# Patient Record
Sex: Male | Born: 1940 | Race: White | Hispanic: No | Marital: Married | Smoking: Former smoker
Health system: Southern US, Community
[De-identification: ages and names within clinical notes are randomized; demographics above are authoritative.]

## PROBLEM LIST (undated history)

## (undated) DIAGNOSIS — C61 Malignant neoplasm of prostate: Secondary | ICD-10-CM

## (undated) DIAGNOSIS — H539 Unspecified visual disturbance: Secondary | ICD-10-CM

## (undated) DIAGNOSIS — E119 Type 2 diabetes mellitus without complications: Secondary | ICD-10-CM

## (undated) DIAGNOSIS — C801 Malignant (primary) neoplasm, unspecified: Secondary | ICD-10-CM

## (undated) DIAGNOSIS — I639 Cerebral infarction, unspecified: Secondary | ICD-10-CM

## (undated) DIAGNOSIS — M1990 Unspecified osteoarthritis, unspecified site: Secondary | ICD-10-CM

## (undated) DIAGNOSIS — I10 Essential (primary) hypertension: Secondary | ICD-10-CM

## (undated) HISTORY — PX: APPENDECTOMY: SHX54

## (undated) HISTORY — PX: BACK SURGERY: SHX140

## (undated) HISTORY — DX: Malignant neoplasm of prostate: C61

## (undated) HISTORY — DX: Cerebral infarction, unspecified: I63.9

## (undated) HISTORY — PX: TONSILLECTOMY: SUR1361

## (undated) HISTORY — PX: EYE SURGERY: SHX253

## (undated) HISTORY — DX: Unspecified visual disturbance: H53.9

## (undated) HISTORY — DX: Type 2 diabetes mellitus without complications: E11.9

---

## 2008-06-06 HISTORY — PX: NECK SURGERY: SHX720

## 2010-07-08 ENCOUNTER — Emergency Department (INDEPENDENT_AMBULATORY_CARE_PROVIDER_SITE_OTHER): Payer: Medicare Other

## 2010-07-08 ENCOUNTER — Emergency Department (HOSPITAL_BASED_OUTPATIENT_CLINIC_OR_DEPARTMENT_OTHER)
Admission: EM | Admit: 2010-07-08 | Discharge: 2010-07-09 | Disposition: A | Discharge status: Home / Self Care | Payer: Medicare Other | Attending: Emergency Medicine | Admitting: Emergency Medicine

## 2010-07-08 DIAGNOSIS — R209 Unspecified disturbances of skin sensation: Secondary | ICD-10-CM

## 2010-07-08 DIAGNOSIS — M5020 Other cervical disc displacement, unspecified cervical region: Secondary | ICD-10-CM

## 2010-07-08 DIAGNOSIS — M47812 Spondylosis without myelopathy or radiculopathy, cervical region: Secondary | ICD-10-CM | POA: Insufficient documentation

## 2010-07-08 DIAGNOSIS — M542 Cervicalgia: Secondary | ICD-10-CM

## 2010-07-08 DIAGNOSIS — M62838 Other muscle spasm: Secondary | ICD-10-CM | POA: Insufficient documentation

## 2010-07-08 DIAGNOSIS — M5412 Radiculopathy, cervical region: Secondary | ICD-10-CM | POA: Insufficient documentation

## 2010-07-08 DIAGNOSIS — E7800 Pure hypercholesterolemia, unspecified: Secondary | ICD-10-CM | POA: Insufficient documentation

## 2010-07-08 DIAGNOSIS — M79609 Pain in unspecified limb: Secondary | ICD-10-CM | POA: Insufficient documentation

## 2010-07-09 ENCOUNTER — Other Ambulatory Visit (HOSPITAL_BASED_OUTPATIENT_CLINIC_OR_DEPARTMENT_OTHER): Payer: Self-pay | Admitting: Emergency Medicine

## 2010-07-09 DIAGNOSIS — R52 Pain, unspecified: Secondary | ICD-10-CM

## 2010-07-09 DIAGNOSIS — M479 Spondylosis, unspecified: Secondary | ICD-10-CM

## 2010-07-10 ENCOUNTER — Emergency Department (HOSPITAL_BASED_OUTPATIENT_CLINIC_OR_DEPARTMENT_OTHER)
Admission: RE | Admit: 2010-07-10 | Discharge: 2010-07-10 | Disposition: A | Discharge status: Home / Self Care | Payer: Medicare Other | Source: Ambulatory Visit | Attending: Emergency Medicine | Admitting: Emergency Medicine

## 2010-07-10 DIAGNOSIS — M47812 Spondylosis without myelopathy or radiculopathy, cervical region: Secondary | ICD-10-CM | POA: Insufficient documentation

## 2010-07-10 DIAGNOSIS — M549 Dorsalgia, unspecified: Secondary | ICD-10-CM | POA: Insufficient documentation

## 2010-07-10 DIAGNOSIS — M5412 Radiculopathy, cervical region: Secondary | ICD-10-CM | POA: Insufficient documentation

## 2010-07-10 DIAGNOSIS — M479 Spondylosis, unspecified: Secondary | ICD-10-CM

## 2012-06-06 HISTORY — PX: CATARACT EXTRACTION: SUR2

## 2015-08-07 ENCOUNTER — Emergency Department (HOSPITAL_BASED_OUTPATIENT_CLINIC_OR_DEPARTMENT_OTHER): Payer: Medicare Other

## 2015-08-07 ENCOUNTER — Emergency Department (HOSPITAL_BASED_OUTPATIENT_CLINIC_OR_DEPARTMENT_OTHER)
Admission: EM | Admit: 2015-08-07 | Discharge: 2015-08-07 | Disposition: A | Discharge status: Home / Self Care | Payer: Medicare Other | Attending: Physician Assistant | Admitting: Physician Assistant

## 2015-08-07 ENCOUNTER — Encounter (HOSPITAL_BASED_OUTPATIENT_CLINIC_OR_DEPARTMENT_OTHER): Payer: Self-pay | Admitting: Emergency Medicine

## 2015-08-07 DIAGNOSIS — Y998 Other external cause status: Secondary | ICD-10-CM | POA: Insufficient documentation

## 2015-08-07 DIAGNOSIS — W1839XA Other fall on same level, initial encounter: Secondary | ICD-10-CM | POA: Diagnosis not present

## 2015-08-07 DIAGNOSIS — I10 Essential (primary) hypertension: Secondary | ICD-10-CM | POA: Diagnosis not present

## 2015-08-07 DIAGNOSIS — Y9289 Other specified places as the place of occurrence of the external cause: Secondary | ICD-10-CM | POA: Insufficient documentation

## 2015-08-07 DIAGNOSIS — Z8739 Personal history of other diseases of the musculoskeletal system and connective tissue: Secondary | ICD-10-CM | POA: Insufficient documentation

## 2015-08-07 DIAGNOSIS — Z859 Personal history of malignant neoplasm, unspecified: Secondary | ICD-10-CM | POA: Diagnosis not present

## 2015-08-07 DIAGNOSIS — Z87891 Personal history of nicotine dependence: Secondary | ICD-10-CM | POA: Insufficient documentation

## 2015-08-07 DIAGNOSIS — S79911A Unspecified injury of right hip, initial encounter: Secondary | ICD-10-CM | POA: Insufficient documentation

## 2015-08-07 DIAGNOSIS — Y9389 Activity, other specified: Secondary | ICD-10-CM | POA: Insufficient documentation

## 2015-08-07 DIAGNOSIS — Z79899 Other long term (current) drug therapy: Secondary | ICD-10-CM | POA: Insufficient documentation

## 2015-08-07 DIAGNOSIS — Z880 Allergy status to penicillin: Secondary | ICD-10-CM | POA: Diagnosis not present

## 2015-08-07 DIAGNOSIS — M25551 Pain in right hip: Secondary | ICD-10-CM

## 2015-08-07 HISTORY — DX: Malignant (primary) neoplasm, unspecified: C80.1

## 2015-08-07 HISTORY — DX: Essential (primary) hypertension: I10

## 2015-08-07 HISTORY — DX: Unspecified osteoarthritis, unspecified site: M19.90

## 2015-08-07 NOTE — ED Notes (Signed)
Right hip/leg pain after a fall a couple of weeks ago. Quick and steady gait. Pain 8/10 with ambulation.

## 2015-08-07 NOTE — ED Notes (Signed)
MD at bedside. 

## 2015-08-07 NOTE — ED Provider Notes (Signed)
CSN: [id number]     Arrival date & time 08/07/15  [id number] History   First MD Initiated Contact with Patient 08/07/15 1003     Chief Complaint  Patient presents with  . Hip Pain     (Consider location/radiation/quality/duration/timing/severity/associated sxs/prior Treatment) HPI   Patient is a 75 year old male with history of hypertension and arthritis. He is presenting today with right hip pain. Patient reports that he fell on it several weeks ago. He reports at first he thought that he was just bruised. He came here for an x-ray today because he still has mild pain. Patient ambulatory without issue. Patient has full range of motion and hip knee and ankle. There is no external signs of trauma.  Past Medical History  Diagnosis Date  . Hypertension   . Cancer ([place])   . Arthritis    Past Surgical History  Procedure Laterality Date  . Back surgery    . Neck surgery  2010  . Appendectomy    . Tonsillectomy    . Eye surgery     No family history on file. Social History  Substance Use Topics  . Smoking status: Former Smoker    Types: Cigarettes  . Smokeless tobacco: None  . Alcohol Use: No    Review of Systems  Constitutional: Negative for activity change.  Respiratory: Negative for shortness of breath.   Cardiovascular: Negative for chest pain.  Gastrointestinal: Negative for abdominal pain.  Musculoskeletal: Positive for arthralgias.      Allergies  Penicillins and Statins  Home Medications   Prior to Admission medications   Medication Sig Start Date End Date Taking? Authorizing Provider  Multiple Vitamin (MULTIVITAMIN) capsule Take 1 capsule by mouth daily.   Yes Historical Provider, MD   BP 136/59 mmHg  Pulse 76  Temp(Src) 98 F (36.7 C) (Oral)  Resp 16  Ht 5\' 9"  (1.753 m)  Wt 225 lb (102.059 kg)  BMI 33.21 kg/m2  SpO2 96% Physical Exam  Constitutional: He is oriented to person, place, and time. He appears well-nourished.  HENT:  Head: Normocephalic.   Eyes: Conjunctivae are normal.  Cardiovascular: Normal rate.   Pulmonary/Chest: Effort normal.  Musculoskeletal:  Full range of hip, knee and ankle. Pt is ambulatory without issue. No overlying erythema.  Neurological: He is oriented to person, place, and time.  Skin: Skin is warm and dry. He is not diaphoretic.  Psychiatric: He has a normal mood and affect. His behavior is normal.    ED Course  Procedures (including critical care time) Labs Review Labs Reviewed - No data to display  Imaging Review Dg Hip Unilat  With Pelvis 2-3 Views Right  08/07/2015  CLINICAL DATA:  [place] 2 weeks ago on lateral right hip and complains of pain. EXAM: DG HIP (WITH OR WITHOUT PELVIS) 2-3V RIGHT COMPARISON:  CT examination 11/18/2013 FINDINGS: Again noted are prostatic implant seeds. Pelvic bony ring is intact. Chronic ossifications or calcifications along the right pubic bone. Right hip is located. Negative for fracture. Mild sclerosis and degenerative changes along the inferior right hip joint. IMPRESSION: No acute bone abnormality to the pelvis or right hip. Electronically Signed   By: Markus Daft M.D.   On: 08/07/2015 10:38   I have personally reviewed and evaluated these images and lab results as part of my medical decision-making.   EKG Interpretation None      MDM   Final diagnoses:  Hip pain, right   patient is a pleasant 75 year old male presenting with remote  trauma to the right hip. This happened several weeks ago. Patient has full range of motion is able to ambulate without issue on the right hip. We'll get x-ray as patient requested. Anticipate negative x-rays and follow-up with PCP.  11:16 AM Xray is negative. Would not do any advanced imaging at this point since he has normal ROM, no pain with movement, and no deficits, steady ambulation.   Courteney Julio Alm, MD 08/07/15 1116

## 2015-08-07 NOTE — Discharge Instructions (Signed)
The xray of your hips was normal today.  YOu have no trouble walking and no pain when the hip is moved. We think this may be just a healing injury. Pleasefollow up with your regular PCP, they may want advanced imaging if there continues to be discomfort.   Heat Therapy Heat therapy can help ease sore, stiff, injured, and tight muscles and joints. Heat relaxes your muscles, which may help ease your pain. Heat therapy should only be used on old, pre-existing, or long-lasting (chronic) injuries. Do not use heat therapy unless told by your doctor. HOW TO USE HEAT THERAPY There are several different kinds of heat therapy, including:  Moist heat pack.  Warm water bath.  Hot water bottle.  Electric heating pad.  Heated gel pack.  Heated wrap.  Electric heating pad. GENERAL HEAT THERAPY RECOMMENDATIONS   Do not sleep while using heat therapy. Only use heat therapy while you are awake.  Your skin may turn pink while using heat therapy. Do not use heat therapy if your skin turns red.  Do not use heat therapy if you have new pain.  High heat or long exposure to heat can cause burns. Be careful when using heat therapy to avoid burning your skin.  Do not use heat therapy on areas of your skin that are already irritated, such as with a rash or sunburn. GET HELP IF:   You have blisters, redness, swelling (puffiness), or numbness.  You have new pain.  Your pain is worse. MAKE SURE YOU:  Understand these instructions.  Will watch your condition.  Will get help right away if you are not doing well or get worse.   This information is not intended to replace advice given to you by your health care provider. Make sure you discuss any questions you have with your health care provider.   Document Released: 08/15/2011 Document Revised: 06/13/2014 Document Reviewed: 07/16/2013 Elsevier Interactive Patient Education [employer].

## 2016-02-18 ENCOUNTER — Ambulatory Visit: Payer: Medicare Other | Admitting: Neurology

## 2016-02-19 ENCOUNTER — Ambulatory Visit: Payer: No Typology Code available for payment source | Admitting: Neurology

## 2016-02-22 ENCOUNTER — Encounter: Payer: Self-pay | Admitting: Neurology

## 2016-02-22 ENCOUNTER — Ambulatory Visit (INDEPENDENT_AMBULATORY_CARE_PROVIDER_SITE_OTHER): Payer: Medicare Other | Admitting: Neurology

## 2016-02-22 DIAGNOSIS — R3915 Urgency of urination: Secondary | ICD-10-CM

## 2016-02-22 DIAGNOSIS — R269 Unspecified abnormalities of gait and mobility: Secondary | ICD-10-CM

## 2016-02-22 DIAGNOSIS — M5000 Cervical disc disorder with myelopathy, unspecified cervical region: Secondary | ICD-10-CM | POA: Diagnosis not present

## 2016-02-22 DIAGNOSIS — G4489 Other headache syndrome: Secondary | ICD-10-CM | POA: Diagnosis not present

## 2016-02-22 DIAGNOSIS — H34239 Retinal artery branch occlusion, unspecified eye: Secondary | ICD-10-CM | POA: Insufficient documentation

## 2016-02-22 DIAGNOSIS — R208 Other disturbances of skin sensation: Secondary | ICD-10-CM | POA: Diagnosis not present

## 2016-02-22 DIAGNOSIS — H34232 Retinal artery branch occlusion, left eye: Secondary | ICD-10-CM

## 2016-02-22 MED ORDER — OXYBUTYNIN CHLORIDE 5 MG PO TABS: 5.0000 mg | ORAL_TABLET | Freq: Three times a day (TID) | ORAL | 5 refills | Status: DC

## 2016-02-22 MED ORDER — GABAPENTIN 300 MG PO CAPS: 300.0000 mg | ORAL_CAPSULE | Freq: Four times a day (QID) | ORAL | 3 refills | Status: DC

## 2016-02-22 NOTE — Progress Notes (Signed)
[facility]  PATIENT: Juan Raymond DOB: [birth year]  REFERRING DOCTOR OR PCP:  Alphonzo Severance PA SOURCE: patient, notes in EMR,   _________________________________   HISTORICAL  CHIEF COMPLAINT:  Chief Complaint  Patient presents with  . Peripheral Neuropathy    Juan Raymond is here with his wife Juan Raymond for eval of numbness in bilat legs from the knees down, including feet, and in bilat hands.  Worse in right leg.  Hx. of lumbar surgery and also just dx.with Diabetes.  He also c/o occasional sharp shooting pains in random areas of head.  Sts.  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  Sts. he had mult. tests to determine the cause of the stroke, was told it was probably from the plaque in the carotid artery.  He had an MRI brain several mos. ago at [facility] and   . Headache    would like RAS to look at it and give an opinion/fim    HISTORY OF PRESENT ILLNESS:  I had the pleasure of seeing your patient, Juan Raymond, at [facility] for a neurologic consultation regarding his peripheral neuropathy, headaches and history of retinal stroke.   He is a 75 year old man who has had numbness for many years  from the knees on down to his feet. Symptoms are numbness > pain.   When present, pain is mostly in the feet, especially the soles. Touch will sometimes feel like burning. He feels the pain improved some after he had cervical spine surgery in 2012.  He still gets cramps.    He also notes some numbness in his fingers. 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 He was referred to Dr. Lang Snow in [facility] who did an ACDF at C3-C4 and C4-C5.   He was having neck pain prior to the surgery and that improved. However, the numbness did not improve.  Due to the continual numbness, at some point he was referred to Dr. Sabra Heck at  regional neurology. He had a NCV/EMG and was told that it just showed minimal nerve changes not enough to explain his symptoms.    He notes some weakness in his legs (esp climbing stairs) and a reduced grip.   Gabapentin helps the painful dysesthesias about 25 percent.Marland Kitchen He is currently on low dose, 300 mg twice a day. He tolerates well and denies any sleepiness or difficulty with balance.  He has urinary frequency and urgency.   He has rare incontinence if he can't get to a bathroom in time.    He has a h/o prostate cancer and had radioactive seeds.   PSA has recently increased and sees Dr. Felipa Eth.   Last year, he also had lumbar surgery (at [facility]).  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   He notes a poor grip  He gets pain pain in the left > right temple intermittently.  Sometimes pain is shooting.    He gets tired chewing but no pain.    ESR was normal 09/2015.        February 2017 he had sudden vision loss on the left and was told he had a retinal stroke.  History and reports are consistent with a branch retinal artery  occlusion. He had a carotid doppler and echocardiogram that were unremarkable.   He takes aspirin.    ESR was normal.    Stroke risk:   He has recently NIDDM and HTN.   He used to be a smoker (quit 3-4 years ago).   He has mild hypercholesterolemia.    I personally reviewed the MRI of the cervical spine dated 07/10/2010 an MRI of the brain dated 09/17/2015. 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 Additionally, there is moderate spinal stenosis at C4-C5 without any associated signal changes within the spinal cord. There are milder degenerative changes at the other cervical levels but do not lead to any spinal cord impingement. The MRI of the brain 09/17/2015 is normal for age with mild cortical  atrophy.   REVIEW OF SYSTEMS: Constitutional: No fevers, chills, sweats, or change in appetite Eyes: Reduced visual acuity OS.  No eye pain.  Ear, nose and throat: No hearing loss, ear pain, nasal congestion, sore throat Cardiovascular: No chest pain, palpitations Respiratory: No shortness of breath at rest or with exertion.   No wheezes GastrointestinaI: No nausea, vomiting, diarrhea, abdominal pain, fecal incontinence Genitourinary: No dysuria, urinary retention or frequency.  No nocturia. Musculoskeletal: as above Integumentary: No rash, pruritus, skin lesions Neurological: as above Psychiatric: No depression at this time.  No anxiety Endocrine: No palpitations, diaphoresis, change in appetite, change in weigh or increased thirst Hematologic/Lymphatic: No anemia, purpura, petechiae. Allergic/Immunologic: No itchy/runny eyes, nasal congestion, recent allergic reactions, rashes  ALLERGIES: Allergies  Allergen Reactions  . Penicillins Anaphylaxis  . Penicillin G Swelling  . Statins Other (See Comments)    Generalized Pain   . Tramadol     Other reaction(s): HALLUCINATIONS    HOME MEDICATIONS:  Current Outpatient Prescriptions:  .  Ascorbic Acid (VITAMIN C) 1000 MG tablet, Take by mouth., Disp: , Rfl:  .  aspirin EC 81 MG tablet, Take 81 mg by mouth., Disp: , Rfl:  .  gabapentin (NEURONTIN) 300 MG capsule, Take 1 capsule (300 mg total) by mouth 4 (four) times daily., Disp: 360 capsule, Rfl: 3 .  Multiple Vitamin (MULTI-VITAMINS) TABS, Take by mouth., Disp: , Rfl:  .  Multiple Vitamin (MULTIVITAMIN) capsule, Take 1 capsule by mouth daily., Disp: , Rfl:  .  rosuvastatin (CRESTOR) 5 MG tablet, Take 5 mg by mouth., Disp: , Rfl:  .  sitaGLIPtin (JANUVIA) 50 MG tablet, Take 50 mg by mouth., Disp: , Rfl:  .  valsartan-hydrochlorothiazide (DIOVAN-HCT) 320-12.5 MG tablet, Take by mouth., Disp: , Rfl:  .  vitamin E 400 UNIT capsule, Take by mouth., Disp: , Rfl:  .  oxybutynin  (DITROPAN) 5 MG tablet, Take 1 tablet (5 mg total) by mouth 3 (three) times daily., Disp: 90 tablet, Rfl: 5  PAST MEDICAL HISTORY: Past Medical History:  Diagnosis Date  . Arthritis   . Cancer ([place])   . Diabetes mellitus without complication ([place])   . Hypertension   . Prostate cancer ([place])   . Stroke ([place])   . Vision abnormalities     PAST SURGICAL HISTORY: Past Surgical History:  Procedure Laterality Date  . APPENDECTOMY    . BACK SURGERY    . CATARACT EXTRACTION Bilateral 2014  . EYE SURGERY    . NECK SURGERY  2010  . TONSILLECTOMY      FAMILY HISTORY: Family History  Problem Relation Age of Onset  . Lung cancer Mother   .  Neuropathy Father     SOCIAL HISTORY:  Social History   Social History  . Marital status: Married    Spouse name: N/A  . Number of children: N/A  . Years of education: N/A   Occupational History  . Not on file.   Social History Main Topics  . Smoking status: Former Smoker    Types: Cigarettes  . Smokeless tobacco: Not on file  . Alcohol use No  . Drug use: No  . Sexual activity: Not on file   Other Topics Concern  . Not on file   Social History Narrative  . No narrative on file     PHYSICAL EXAM  Vitals:   02/22/16 0948  BP: 110/66  Pulse: 68  Resp: 16  Weight: 238 lb (108 kg)  Height: _0  (1.753 m)    Body mass index is 35.15 kg/m.   General: The patient is well-developed and well-nourished and in no acute distress  Eyes:  Funduscopic exam shows normal optic discs and retinal vessels.  Neck: The neck is supple, no carotid bruits are noted.  The neck is nontender.  Cardiovascular: The heart has a regular rate and rhythm with a normal S1 and S2. There were no murmurs, gallops or rubs. Lungs are clear to auscultation.  Skin: Extremities are without significant edema.  Musculoskeletal:  Back is nontender  Neurologic Exam  Mental status: The patient is alert and oriented x 3 at the time of the examination.  The patient has apparent normal recent and remote memory, with an apparently normal attention span and concentration ability.   Speech is normal.  Cranial nerves: Extraocular movements are full. Pupils are equal, round, and reactive to light and accomodation.  Visual acuity is mildly reduced out of the left eye.  Facial symmetry is present. There is good facial sensation to soft touch bilaterally.Facial strength is normal.  Trapezius and sternocleidomastoid strength is normal. No dysarthria is noted.  The tongue is midline, and the patient has symmetric elevation of the soft palate. No obvious hearing deficits are noted.  Motor:  Muscle bulk is normal.   Tone is normal. Strength is  5 / 5 in all 4 extremities.   Sensory: Sensory testing is intact to pinprick, soft touch and vibration sensation in arms.   He has reduced vibratory sensation in toes worse than ankles.   He has mild reduced touch and temperature with allodynia below shins.    Coordination: Cerebellar testing reveals good finger-nose-finger and heel-to-shin bilaterally.  Gait and station: Station is normal.   Gait is mildly wide. Tandem gait is wide. Romberg is borderline positive.   Reflexes: Deep tendon reflexes are symmetric and normal bilaterally.   Plantar responses are flexor.    DIAGNOSTIC DATA (LABS, IMAGING, TESTING) - I reviewed patient records, labs, notes, testing and imaging myself where available.      ASSESSMENT AND PLAN  Cervical disc disease with myelopathy - Plan: MR Cervical Spine Wo Contrast  Dysesthesia - Plan: MR Cervical Spine Wo Contrast  Gait disturbance - Plan: MR Cervical Spine Wo Contrast  Urinary urgency  Other headache syndrome  Retinal arterial branch occlusion, left   In summary, Mr. Juan Raymond is a 75 year old man with a long history of numbness and also has difficulty with his bladder and gait. Additionally, he has headaches and had a retinal arterial branch occlusion OS.   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 This could explain his difficulty with sensation, gait and bladder.    For the dysesthesias with allodynia, I will increase the gabapentin to a total of 1200 mg daily.  This may also help the headache. If symptoms do not improve, consider adding a tricyclic agent at night or lamotrigine for the bladder, I will have him try oxybutynin.  To make sure that he does not have a separate abnormality involving his cervical spinal cord, we will check an MRI. Based on findings, we may need to refer her to neurosurgery.  He will return to see me in 2-3 months or sooner if there are new or worsening neurologic symptoms.  Thank you for asking me to see Mr. Juan Raymond. Please let me know if I can be of further assistance with him or other patients in the future.   Anndee Connett A. Felecia Shelling, MD, PhD 0/76/2263, 3:35 PM Certified in Neurology, Clinical Neurophysiology, Sleep Medicine, Pain Medicine and Neuroimaging  [facility] [address], [place], [place] [address] [phone]

## 2016-02-24 ENCOUNTER — Encounter: Payer: Self-pay | Admitting: Neurology

## 2016-02-29 ENCOUNTER — Telehealth: Payer: Self-pay | Admitting: Neurology

## 2016-02-29 NOTE — Telephone Encounter (Signed)
Patient called regarding scheduling MRI of neck at [facility]. Please call [phone].

## 2016-03-03 NOTE — Telephone Encounter (Signed)
Got the Fax # [phone] to [facility] and I will fax his MRI order over there. The patient is aware of this.

## 2016-03-09 NOTE — Telephone Encounter (Signed)
Pt called to advise the form that was sent to [facility] did not have provider signature or insurance information and nothing can be done until it is completed and refaxed. The patient said it has taken a long time to get this scheduled and he is ready to have the scan.

## 2016-03-11 NOTE — Telephone Encounter (Signed)
Patient called, states "this is the 6th time he has tried to get the scheduling of MRI taken care of, states this is the 3rd or 4th time this week that he's called, can't go to [facility], has to be done at [facility], states the form that was sent to [facility] wasn't signed by doctor and didn't have the insurance information on the form. What is it going to take to get this taken care of".

## 2016-03-11 NOTE — Telephone Encounter (Signed)
Called the patient and spoke with him regarding the order. I told him I would refax to [facility]. [facility]. It was made for Tuesday the 10th of October at 4:15. I called the patient and left him a VM and told him to call us back if he had any questions.

## 2016-03-16 ENCOUNTER — Telehealth: Payer: Self-pay | Admitting: Neurology

## 2016-03-16 NOTE — Telephone Encounter (Signed)
I have spoken with Juan Raymond this afternoon.  He had MRI done at [facility], so it is not in our system.  Will check with RAS tomorrow to see if he can view images, and call pt. with result/fim

## 2016-03-16 NOTE — Telephone Encounter (Signed)
Patient is calling to get MRI results. °

## 2016-03-17 NOTE — Telephone Encounter (Signed)
Please let him know I took a look at the MRI. He does not have any pressure on the spinal cord. There is a little scar inside of the spinal cord where it was compressed in 2012 but this looks old. Degenerative changes at C2-C3 and C6-C7 are worse and there were in 2012 but not bad enough for surgery.

## 2016-03-17 NOTE — Telephone Encounter (Signed)
Pt called to check on MRI results.Please call

## 2016-03-17 NOTE — Telephone Encounter (Signed)
I have spoken with Juan Raymond this afternoon, and per RAS, advised that MRI shows no cord compression.  Does shows old scar from 2012 compression. Also shows degen. changes at C3-4 and C6-7, but not bad enough for surgery. Will go into greater detail at f/u appt./fim

## 2016-04-07 ENCOUNTER — Encounter: Payer: Self-pay | Admitting: Neurology

## 2016-04-07 ENCOUNTER — Ambulatory Visit (INDEPENDENT_AMBULATORY_CARE_PROVIDER_SITE_OTHER): Payer: Medicare Other | Admitting: Neurology

## 2016-04-07 VITALS — BP 118/68 | HR 70 | Resp 18 | Ht 69.0 in | Wt 236.0 lb

## 2016-04-07 DIAGNOSIS — G4489 Other headache syndrome: Secondary | ICD-10-CM

## 2016-04-07 DIAGNOSIS — M5000 Cervical disc disorder with myelopathy, unspecified cervical region: Secondary | ICD-10-CM | POA: Diagnosis not present

## 2016-04-07 DIAGNOSIS — R3915 Urgency of urination: Secondary | ICD-10-CM | POA: Diagnosis not present

## 2016-04-07 DIAGNOSIS — R269 Unspecified abnormalities of gait and mobility: Secondary | ICD-10-CM

## 2016-04-07 DIAGNOSIS — H34232 Retinal artery branch occlusion, left eye: Secondary | ICD-10-CM | POA: Diagnosis not present

## 2016-04-07 DIAGNOSIS — R208 Other disturbances of skin sensation: Secondary | ICD-10-CM | POA: Diagnosis not present

## 2016-04-07 MED ORDER — GABAPENTIN 600 MG PO TABS: 600.0000 mg | ORAL_TABLET | Freq: Three times a day (TID) | ORAL | 3 refills | Status: DC

## 2016-04-07 NOTE — Progress Notes (Signed)
[facility]  PATIENT: Juan Raymond DOB: [birth year]  REFERRING DOCTOR OR PCP:  Alphonzo Severance PA SOURCE: patient, notes in EMR,   _________________________________   HISTORICAL  CHIEF COMPLAINT:  Chief Complaint  Patient presents with  . Peripheral Neuropathy    Sts. numbness in legs, pain in feet is about the same.   At last ov, Gabapentin was increased to 373m qid, but he is still only taking it tid--just misunderstood directions.  [facility].  He would like to discuss these results in greater detail./fim  . Gait Disturbance    HISTORY OF PRESENT ILLNESS:  Juan Blixtis a 75year old man who has had numbness for many years from the knees on down to his feet. He is reporting numbness > pain or weakness.     His pain is mostly in the feet is bilateral. He does though have numbness and weakness in the arms and gait is poor.     [facility] shows the old scar within the spinal cord at C3-C4.  He has C3-C6 ACDF.  Though he has some further advancement of the degenerative changes at C6-C7 compared to the prior MRI, there is no spinal stenosis at this time.  Dysesthesias:  His dysesthesias are bothersome.   Gabapentin helps the painful dysesthesias some 3 mg 3 times a day. We discussed a higher dose..Marland KitchenHe tolerates well and denies any sleepiness or difficulty with balance.   He has not tried lamotrigine.  Bladder:  He has urinary frequency and urgency.   He has rare incontinence if he can't get to a bathroom in time.    He has a h/o prostate cancer and had radioactive seeds.   PSA has recently increased and sees Dr. SFelipa Eth   HA Pain:   He gets pain pain in the left > right temple intermittently.  Sometimes pain is shooting.   In the past, he also noted more pain with chewing. The sedimentation rate was normal.   BRAO:   February 2017 he had sudden vision loss  on the left and was told he had a retinal stroke.  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 He had a carotid doppler and echocardiogram that were unremarkable.   He takes aspirin.    ESR was normal.  His stroke risks are:   He has NIDDM and HTN.   He used to be a smoker (quit 3-4 years ago).   He has mild hypercholesterolemia.    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 [facility]-C4 and C4-C5.   He was having neck pain prior to the surgery and that improved. However, the numbness did not improve.  Due to the continual numbness, at some point he was referred to Dr. MSabra Heckat regional neurology. He had a NCV/EMG and was told that it just showed minimal nerve changes not enough to explain his symptoms.    He notes some weakness in his legs (esp climbing stairs) and a reduced grip.  [facility]  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   He notes a poor grip    REVIEW OF SYSTEMS: Constitutional: No fevers, chills, sweats, or change in appetite Eyes: Reduced visual acuity OS.  No eye pain.  Ear, nose and throat: No hearing loss, ear pain, nasal congestion, sore throat Cardiovascular: No chest pain, palpitations Respiratory: No shortness of breath at rest or with exertion.   No wheezes GastrointestinaI: No nausea, vomiting, diarrhea, abdominal pain, fecal incontinence Genitourinary: Nocturia has improved on oxybutynin. Musculoskeletal: as above Integumentary: No rash, pruritus, skin lesions Neurological: as above Psychiatric: No depression at this time.  No anxiety Endocrine: No palpitations, diaphoresis, change in appetite,  change in weigh or increased thirst Hematologic/Lymphatic: No anemia, purpura, petechiae. Allergic/Immunologic: No itchy/runny eyes, nasal congestion, recent allergic reactions, rashes  ALLERGIES: Allergies  Allergen Reactions  . Penicillins Anaphylaxis  . Penicillin G Swelling  . Statins Other (See Comments)    Generalized Pain   . Tramadol     Other reaction(s): HALLUCINATIONS    HOME MEDICATIONS:  Current Outpatient Prescriptions:  .  Ascorbic Acid (VITAMIN C) 1000 MG tablet, Take by mouth., Disp: , Rfl:  .  aspirin EC 81 MG tablet, Take 81 mg by mouth., Disp: , Rfl:  .  gabapentin (NEURONTIN) 300 MG capsule, Take 1 capsule (300 mg total) by mouth 4 (four) times daily., Disp: 360 capsule, Rfl: 3 .  Multiple Vitamin (MULTI-VITAMINS) TABS, Take by mouth., Disp: , Rfl:  .  Multiple Vitamin (MULTIVITAMIN) capsule, Take 1 capsule by mouth daily., Disp: , Rfl:  .  oxybutynin (DITROPAN) 5 MG tablet, Take 1 tablet (5 mg total) by mouth 3 (three) times daily., Disp: 90 tablet, Rfl: 5 .  rosuvastatin (CRESTOR) 5 MG tablet, Take 5 mg by mouth., Disp: , Rfl:  .  valsartan-hydrochlorothiazide (DIOVAN-HCT) 320-12.5 MG tablet, Take by mouth., Disp: , Rfl:  .  vitamin E 400 UNIT capsule, Take by mouth., Disp: , Rfl:   PAST MEDICAL HISTORY: Past Medical History:  Diagnosis Date  . Arthritis   . Cancer ([place])   . Diabetes mellitus without complication ([place])   . Hypertension   . Prostate cancer ([place])   . Stroke ([place])   . Vision abnormalities     PAST SURGICAL HISTORY: Past Surgical History:  Procedure Laterality Date  . APPENDECTOMY    . BACK SURGERY    . CATARACT EXTRACTION Bilateral 2014  . EYE SURGERY    . NECK SURGERY  2010  . TONSILLECTOMY      FAMILY HISTORY: Family History  Problem Relation Age of Onset  . Lung cancer Mother   . Neuropathy Father     SOCIAL HISTORY:  Social History   Social History  . Marital status: Married    Spouse name: N/A  . Number of  children: N/A  . Years of education: N/A   Occupational History  . Not on file.   Social History Main Topics  . Smoking status: Former Smoker    Types: Cigarettes  . Smokeless tobacco: Not on file  . Alcohol use No  . Drug use: No  . Sexual activity: Not on file   Other Topics Concern  . Not on file   Social History Narrative  . No narrative on file     PHYSICAL EXAM  Vitals:   04/07/16 0835  BP: 118/68  Pulse: 70  Resp: 18  Weight: 236 lb (107 kg)  Height: 5'  9" (1.753 m)    Body mass index is 34.85 kg/m.   General: The patient is well-developed and well-nourished and in no acute distress  Neck: The neck is supple, no carotid bruits are noted.  The neck is tender over the left occiput  Neurologic Exam  Mental status: The patient is alert and oriented x 3 at the time of the examination. The patient has apparent normal recent and remote memory, with an apparently normal attention span and concentration ability.   Speech is normal.  Cranial nerves: Extraocular movements are full. There is good facial sensation to soft touch bilaterally.Facial strength is normal.  Trapezius and sternocleidomastoid strength is normal. No dysarthria is noted.  The tongue is midline, and the patient has symmetric elevation of the soft palate. No obvious hearing deficits are noted.  Motor:  Muscle bulk is normal.   Tone is normal. Strength is  5 / 5 in all 4 extremities.   Sensory: Sensory testing is intact to pinprick, soft touch and vibration sensation in arms.   He has reduced vibratory sensation in toes worse than ankles.   He has mild reduced touch and temperature with allodynia below shins.    Coordination: Cerebellar testing reveals good finger-nose-finger  bilaterally.  Gait and station: Station is normal.   Gait is wide. Tandem gait is very wide. Romberg is borderline positive.   Reflexes: Deep tendon reflexes are symmetric and normal bilaterally.   Plantar responses are  flexor.    DIAGNOSTIC DATA (LABS, IMAGING, TESTING) - I reviewed patient records, labs, notes, testing and imaging myself where available.      ASSESSMENT AND PLAN  No diagnosis found.    1.  I will increase the gabapentin to 600 mg by mouth 3 times a day. If this does not help after a few more weeks, I will consider adding lamotrigine and titrated up to 100 mg by mouth daily. 2.    Add alpha lipoic acid 600 mg by mouth twice a day.    We will see do a trial off before a meal. He will try 4-AP 5 mg by mouth 3 times a day. We will get this from a compounding pharmacist. 3.   Continue oxybutynin for the bladder 4.   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 5.   He will return to see me in 4 months or sooner if there are new or worsening neurologic symptoms.   [place] A. Felecia Shelling, MD, PhD 04/12/3566, 0:14 AM Certified in Neurology, Clinical Neurophysiology, Sleep Medicine, Pain Medicine and Neuroimaging  [facility] [address], [place], [place] [address] [phone]

## 2016-04-27 ENCOUNTER — Ambulatory Visit: Payer: Medicare Other | Admitting: Neurology

## 2016-05-10 ENCOUNTER — Telehealth: Payer: Self-pay | Admitting: Neurology

## 2016-05-10 NOTE — Telephone Encounter (Signed)
Pt called wants to know the name of the compounding pharmacy.

## 2016-05-10 NOTE — Telephone Encounter (Signed)
LMOM that the compounding pharmacy is [facility] at [address] [place], [place].  Phone# [phone].  He does not need to return this call unless he has further questions/fim

## 2016-07-08 ENCOUNTER — Ambulatory Visit: Payer: Medicare Other | Admitting: Neurology

## 2016-10-13 ENCOUNTER — Other Ambulatory Visit: Payer: Self-pay | Admitting: Neurology

## 2017-06-08 ENCOUNTER — Encounter: Payer: Self-pay | Admitting: Internal Medicine

## 2017-06-08 ENCOUNTER — Ambulatory Visit (INDEPENDENT_AMBULATORY_CARE_PROVIDER_SITE_OTHER): Payer: Medicare Other | Admitting: Internal Medicine

## 2017-06-08 VITALS — BP 118/62 | HR 77 | Ht 68.5 in | Wt 244.8 lb

## 2017-06-08 DIAGNOSIS — J181 Lobar pneumonia, unspecified organism: Secondary | ICD-10-CM | POA: Diagnosis not present

## 2017-06-08 DIAGNOSIS — J189 Pneumonia, unspecified organism: Secondary | ICD-10-CM | POA: Insufficient documentation

## 2017-06-08 DIAGNOSIS — R0609 Other forms of dyspnea: Secondary | ICD-10-CM

## 2017-06-08 NOTE — Progress Notes (Signed)
Subjective:     Patient ID: Juan Raymond, male   DOB: 1941/01/04,    MRN: [id number]  HPI  70 yowm quit smoking 06/2013 @ wt 210-215  no pulmonary symptoms then variable wt gain / sob since then (correlates well per wife, pt with no insight on this issue)  referred to pulmonary clinic 06/08/2017 by  Lilia Pro for cough / dx of Pna with acute symptoms onset late Nov 2018.  06/08/2017 1st [facility] office visit/ Juan Raymond   Chief Complaint  Patient presents with  . Pulmonary Consult    Referred by Alphonzo Severance, PA for f/u on recent PNA. Pt states he was dxed with PNA 05/09/17. He was bothered by cough and congestion, but reports that this has already resolved. His throat does feel "scratchy" at times. He has had DOE for at least the past year- notices when he walks up steps.   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   Chronic doe = MMRC2 = can't walk a nl pace on a flat grade s sob but does fine slow and flat  Has saba not convinced helps so doesn't use often.   No obvious day to day or daytime variability or assoc excess/ purulent sputum or mucus plugs or hemoptysis or cp or chest tightness, subjective wheeze or overt sinus or hb symptoms. No unusual exposure hx or h/o childhood pna/ asthma or knowledge of premature birth.  Sleeping ok flat without nocturnal  or early am exacerbation  of respiratory  c/o's or need for noct saba. Also denies any obvious fluctuation of symptoms with weather or environmental changes or other aggravating or alleviating factors except as outlined above   Current Allergies, Complete Past Medical History, Past Surgical History, Family History, and Social History were reviewed in [employer]  record.  ROS  The following are not active complaints unless bolded Hoarseness, sore throat, dysphagia, dental problems, itching, sneezing,  nasal congestion or discharge of excess mucus or purulent secretions, ear ache,   fever, chills, sweats, unintended wt loss or wt gain, classically pleuritic or exertional cp,  orthopnea pnd or leg swelling, presyncope, palpitations, abdominal pain, anorexia, nausea, vomiting, diarrhea  or change in bowel habits or change in bladder habits, change in stools or change in urine, dysuria, hematuria,  rash, arthralgias, visual complaints, headache, numbness, weakness or ataxia or problems with walking or coordination,  change in mood/affect or memory.        Current Meds  Medication Sig  . albuterol (VENTOLIN HFA) 108 (90 Base) MCG/ACT inhaler Inhale 2 puffs into the lungs every 4 (four) hours as needed.  Marland Kitchen aspirin EC 81 MG tablet Take 81 mg by mouth.  . gabapentin (NEURONTIN) 600 MG tablet Take 1 tablet (600 mg total) by mouth 3 (three) times daily.  Marland Kitchen oxybutynin (DITROPAN) 5 MG tablet TAKE 1 TABLET(5 MG) BY MOUTH THREE TIMES DAILY  . valsartan-hydrochlorothiazide (DIOVAN-HCT) 320-12.5 MG tablet Take 1 tablet by mouth daily.             Review of Systems     Objective:   Physical Exam    amb obese wm nad   Wt Readings from  Last 3 Encounters:  06/08/17 244 lb 12.8 oz (111 kg)  04/07/16 236 lb (107 kg)  02/22/16 238 lb (108 kg)     Vital signs reviewed - Note on arrival 02 sats  96% on RA     HEENT: nl turbinates bilaterally, and oropharynx. Nl external ear canals without cough  - top denture/ bottom partial    NECK :  without JVD/Nodes/TM/ nl carotid upstrokes bilaterally   LUNGS: no acc muscle use,  Nl contour chest which is clear to A and P bilaterally though BS distant and without cough on insp or exp maneuvers   CV:  RRR  no s3 or murmur or increase in P2, and no edema   ABD:  Tensely obese but  nontender with limited inspiratory  excursion in the supine position. No bruits or organomegaly appreciated, bowel sounds nl  MS:  Nl gait/ ext warm without deformities, calf tenderness, cyanosis or clubbing No obvious joint restrictions   SKIN: warm and dry - extensive bruising R lower chest ant/laterally  NEURO:  alert, approp, nl sensorium with  no motor or cerebellar deficits apparent.      I personally reviewed images and agree with radiology impression as follows:  CXR:   05/31/17 Mild left base infiltrate consistent with pneumonia.    Assessment:

## 2017-06-08 NOTE — Patient Instructions (Addendum)
Please schedule a follow up office visit in 4 weeks, sooner if needed with cxr and full pfts on return

## 2017-06-09 ENCOUNTER — Encounter: Payer: Self-pay | Admitting: Internal Medicine

## 2017-06-09 DIAGNOSIS — R0609 Other forms of dyspnea: Secondary | ICD-10-CM | POA: Insufficient documentation

## 2017-06-09 NOTE — Assessment & Plan Note (Signed)
Quit smoking 06/2013 with wt 210 baseline - PFT's rec 06/08/2017   Says back near baseline now and saba not helping    When respiratory symptoms begin or become refractory well after a patient reports complete smoking cessation,  Especially when this wasn't the case while they were smoking, a red flag is raised based on the work of Dr Kris Mouton which states:  if you quit smoking when your best day FEV1 is still well preserved it is highly unlikely you will progress to severe disease.  That is to say, once the smoking stops,  the symptoms should not suddenly erupt or markedly worsen.  If so, the differential diagnosis should include  obesity/deconditioning,  LPR/Reflux/Aspiration syndromes,  occult CHF, or  especially side effect of medications commonly used in this population.    Will return for full pfts to sort out

## 2017-06-09 NOTE — Assessment & Plan Note (Addendum)
Onset of symptoms abrupt p commercial air travel late nov 2018   Clinically he's back to 95% baseline with nl exam so suspect cxr lag here and can wait until return in 4 weeks for f/u cxr then    Discussed in detail all the  indications, usual  risks and alternatives  relative to the benefits with patient and wife  who agree  to proceed with conservative f/u as outlined  Given high likelihood the resdiual cxr changes are all benign.

## 2017-07-05 ENCOUNTER — Other Ambulatory Visit: Payer: Self-pay | Admitting: Neurology

## 2017-07-06 ENCOUNTER — Other Ambulatory Visit: Payer: Self-pay

## 2017-07-06 DIAGNOSIS — J189 Pneumonia, unspecified organism: Secondary | ICD-10-CM

## 2017-07-06 DIAGNOSIS — J181 Lobar pneumonia, unspecified organism: Principal | ICD-10-CM

## 2017-07-07 ENCOUNTER — Ambulatory Visit (INDEPENDENT_AMBULATORY_CARE_PROVIDER_SITE_OTHER)
Admission: RE | Admit: 2017-07-07 | Discharge: 2017-07-07 | Disposition: A | Discharge status: Home / Self Care | Payer: Medicare Other | Source: Ambulatory Visit | Attending: Internal Medicine | Admitting: Internal Medicine

## 2017-07-07 ENCOUNTER — Ambulatory Visit (INDEPENDENT_AMBULATORY_CARE_PROVIDER_SITE_OTHER): Payer: Medicare Other | Admitting: Internal Medicine

## 2017-07-07 ENCOUNTER — Encounter: Payer: Self-pay | Admitting: Internal Medicine

## 2017-07-07 VITALS — BP 112/64 | HR 72 | Ht 68.75 in | Wt 243.0 lb

## 2017-07-07 DIAGNOSIS — J189 Pneumonia, unspecified organism: Secondary | ICD-10-CM

## 2017-07-07 DIAGNOSIS — J181 Lobar pneumonia, unspecified organism: Secondary | ICD-10-CM | POA: Diagnosis not present

## 2017-07-07 DIAGNOSIS — R0609 Other forms of dyspnea: Secondary | ICD-10-CM

## 2017-07-07 LAB — PULMONARY FUNCTION TEST
DL/VA % pred: 84 %
DL/VA: 3.81 ml/min/mmHg/L
DLCO unc % pred: 45 %
DLCO unc: 14.05 ml/min/mmHg
FEF 25-75 Post: 1.45 L/sec
FEF 25-75 Pre: 1.05 L/sec
FEF2575-%CHANGE-POST: 37 %
FEF2575-%Pred-Post: 72 %
FEF2575-%Pred-Pre: 52 %
FEV1-%CHANGE-POST: 9 %
FEV1-%PRED-POST: 56 %
FEV1-%Pred-Pre: 51 %
FEV1-PRE: 1.44 L
FEV1-Post: 1.59 L
FEV1FVC-%CHANGE-POST: 0 %
FEV1FVC-%Pred-Pre: 101 %
FEV6-%Change-Post: 12 %
FEV6-%PRED-PRE: 53 %
FEV6-%Pred-Post: 59 %
FEV6-PRE: 1.95 L
FEV6-Post: 2.19 L
FEV6FVC-%Change-Post: 1 %
FEV6FVC-%PRED-PRE: 105 %
FEV6FVC-%Pred-Post: 106 %
FVC-%CHANGE-POST: 10 %
FVC-%PRED-POST: 56 %
FVC-%Pred-Pre: 50 %
FVC-POST: 2.2 L
FVC-PRE: 1.99 L
POST FEV6/FVC RATIO: 99 %
Post FEV1/FVC ratio: 72 %
Pre FEV1/FVC ratio: 73 %
Pre FEV6/FVC Ratio: 98 %
RV % pred: 89 %
RV: 2.27 L
TLC % pred: 65 %
TLC: 4.44 L

## 2017-07-07 NOTE — Patient Instructions (Addendum)
We will call you to arrange follow up on your cxr  - add:  ct / fluoro rc

## 2017-07-07 NOTE — Progress Notes (Signed)
Subjective:     Patient ID: Juan Raymond, male   DOB: 1941-01-02,    MRN: [id number]    Brief patient profile:  41 yowm quit smoking 06/2013 @ wt 210-215  no pulmonary symptoms then variable wt gain / sob since then (correlates well per wife, pt with no insight on this issue)  referred to pulmonary clinic 06/08/2017 by  Lilia Pro for cough / dx of Pna with acute symptoms onset late Nov 2018.   History of Present Illness  06/08/2017 1st [place] [facility] office visit/ Jina Olenick   Chief Complaint  Patient presents with  . Pulmonary Consult    Referred by Alphonzo Severance, PA for f/u on recent PNA. Pt states he was dxed with PNA 05/09/17. He was bothered by cough and congestion, but reports that this has already resolved. His throat does feel "scratchy" at times. He has had DOE for at least the past year- notices when he walks up steps.   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  Chronic doe = MMRC2 = can't walk a nl pace on a flat grade s sob but does fine slow and flat  Has saba not convinced helps so doesn't use often. rec No change rx   07/07/2017  f/u ov/Dellie Piasecki re:  Doe/ sp cap with restrictive changes only on pfts 07/07/2017  Chief Complaint  Patient presents with  . Follow-up    CXR and PFT's done today. His breathing is at his normal baseline. No cough or throat irritation. He rarely uses his albuterol.   Dyspnea:  Slowed down more by legs Cough: no Sleep: ok flat No need for saba/ has 02 uses prn   No obvious day to day or daytime variability or assoc excess/ purulent sputum or mucus plugs or hemoptysis or cp or chest tightness, subjective wheeze or overt sinus or hb symptoms. No unusual exposure hx or h/o childhood pna/ asthma or knowledge of  premature birth.  Sleeping ok flat without nocturnal  or early am exacerbation  of respiratory  c/o's or need for noct saba. Also denies any obvious fluctuation of symptoms with weather or environmental changes or other aggravating or alleviating factors except as outlined above   Current Allergies, Complete Past Medical History, Past Surgical History, Family History, and Social History were reviewed in [employer] record.  ROS  The following are not active complaints unless bolded Hoarseness, sore throat, dysphagia, dental problems, itching, sneezing,  nasal congestion or discharge of excess mucus or purulent secretions, ear ache,   fever, chills, sweats, unintended wt loss or wt gain, classically pleuritic or exertional cp,  orthopnea pnd or leg swelling, presyncope, palpitations, abdominal pain, anorexia, nausea, vomiting, diarrhea  or change in bowel habits or change in bladder habits, change in stools or change in urine, dysuria, hematuria,  rash, arthralgias, visual complaints, headache, numbness, weakness or ataxia or problems with walking or coordination,  change in mood/affect or memory.        Current Meds  Medication Sig  . albuterol (VENTOLIN HFA) 108 (90 Base) MCG/ACT inhaler Inhale 2 puffs into the lungs every 4 (four) hours as needed.  Marland Kitchen aspirin EC 81 MG tablet Take  81 mg by mouth.  . gabapentin (NEURONTIN) 600 MG tablet Take 1 tablet (600 mg total) by mouth 3 (three) times daily.  . valsartan-hydrochlorothiazide (DIOVAN-HCT) 320-12.5 MG tablet Take 1 tablet by mouth daily.            Objective:   Physical Exam  amb mod obese wm nad   07/07/2017   06/08/17 244 lb 12.8 oz (111 kg)  04/07/16 236 lb (107 kg)  02/22/16 238 lb (108 kg)    Vital signs reviewed - Note on arrival 02 sats  94% on RA      HEENT: nl   turbinates bilaterally, and oropharynx. Nl external ear canals without cough reflex - top denture, lower partial    NECK :  without  JVD/Nodes/TM/ nl carotid upstrokes bilaterally   LUNGS: no acc muscle use,  Nl contour chest  With slt decrease BS at R base otherwise clear    CV:  RRR  no s3 or murmur or increase in P2, and no edema   ABD:  Obese/ soft and nontender with limited inspiratory excursion in the supine position. No bruits or organomegaly appreciated, bowel sounds nl  MS:  Nl gait/ ext warm without deformities, calf tenderness, cyanosis or clubbing No obvious joint restrictions   SKIN: warm and dry without lesions    NEURO:  alert, approp, nl sensorium with  no motor or cerebellar deficits apparent.          CXR PA and Lateral:   07/07/2017 :    I personally reviewed images and agree with radiology impression as follows:    There is elevation of the right diaphragm. There is mild left basilar atelectasis. There is no focal parenchymal opacity. There is no pleural effusion or pneumothorax. The heart and mediastinal contours are unremarkable.  The osseous structures are unremarkable.  IMPRESSION: No active cardiopulmonary disease.      Assessment:

## 2017-07-07 NOTE — Progress Notes (Signed)
PFT done today. 

## 2017-07-09 ENCOUNTER — Encounter: Payer: Self-pay | Admitting: Internal Medicine

## 2017-07-09 NOTE — Assessment & Plan Note (Signed)
Quit smoking 06/2013 with wt 210 baseline - PFT's 07/07/2017   No significant obst/  erv 56%  dlco 45 corrects to 84 with minimal curvature   He has obesity with a chronically elevated R HD ? Etiology - see sep a/p  No evidence here of copd / could have mild AB but can control this with saba

## 2017-07-09 NOTE — Assessment & Plan Note (Signed)
Still concerned about cxr changes since don't have a baseline, esp re elevated R HD  rec CTa chest and fluoro to complete the w/u  Discussed in detail all the  indications, usual  risks and alternatives  relative to the benefits with patient who agrees to proceed with w/u as outlined.     Each maintenance medication was reviewed in detail including most importantly the difference between maintenance and as needed and under what circumstances the prns are to be used.  Please see AVS for specific  Instructions which are unique to this visit and I personally typed out  which were reviewed in detail in writing with the patient and a copy provided.

## 2017-07-10 ENCOUNTER — Telehealth: Payer: Self-pay | Admitting: *Deleted

## 2017-07-10 NOTE — Telephone Encounter (Signed)
-----   Message from Tanda Rockers, MD sent at 07/09/2017  6:15 AM EST ----- After I reviewed his whole record I rec  CT chest s contrast Fluoro diaphragms (so should have both at [facility])   No rush -

## 2017-07-10 NOTE — Progress Notes (Signed)
See phone note dated 07/10/17

## 2017-07-11 NOTE — Telephone Encounter (Signed)
LMTCB

## 2017-07-13 ENCOUNTER — Encounter: Payer: Self-pay | Admitting: *Deleted

## 2017-07-13 NOTE — Telephone Encounter (Signed)
Letter mailed to the pt. 

## 2017-07-17 ENCOUNTER — Telehealth: Payer: Self-pay | Admitting: Internal Medicine

## 2017-07-17 NOTE — Telephone Encounter (Signed)
Attempted to call pt but no answer.   Left message for pt to return our call x1 

## 2017-07-21 ENCOUNTER — Telehealth: Payer: Self-pay | Admitting: Internal Medicine

## 2017-07-21 NOTE — Telephone Encounter (Signed)
Called patient unable to reach left message to give [place] a call back regarding result.

## 2017-07-24 NOTE — Telephone Encounter (Signed)
Patient called back about results. Although MW result note says that no further xray is needed patient states that his letter says MW is wanting a few more tests to be done. MW please advise on this. Thanks.

## 2017-07-24 NOTE — Telephone Encounter (Signed)
Radiology feels the changes are chronic which is good but doesn't explain the findings so ideally if no one ever mentioned the problem with the R diaphragm being high he needs CT with contrast and fluoro but these are completely optional/ elective and happy to order them now or set up f/u ov in 3 months to regroup then as it does not look like we set up f/u ov at time of last ov

## 2017-07-24 NOTE — Telephone Encounter (Signed)
Called patient, unable to reach left message to give us a call back. 

## 2019-06-13 IMAGING — DX DG CHEST 2V
2 series · 2 of 2 positions shown · non-contrast
Comparison: 05/31/2017

CLINICAL DATA: Chronic shortness of breath

EXAM:
CHEST  2 VIEW

[chest pa]
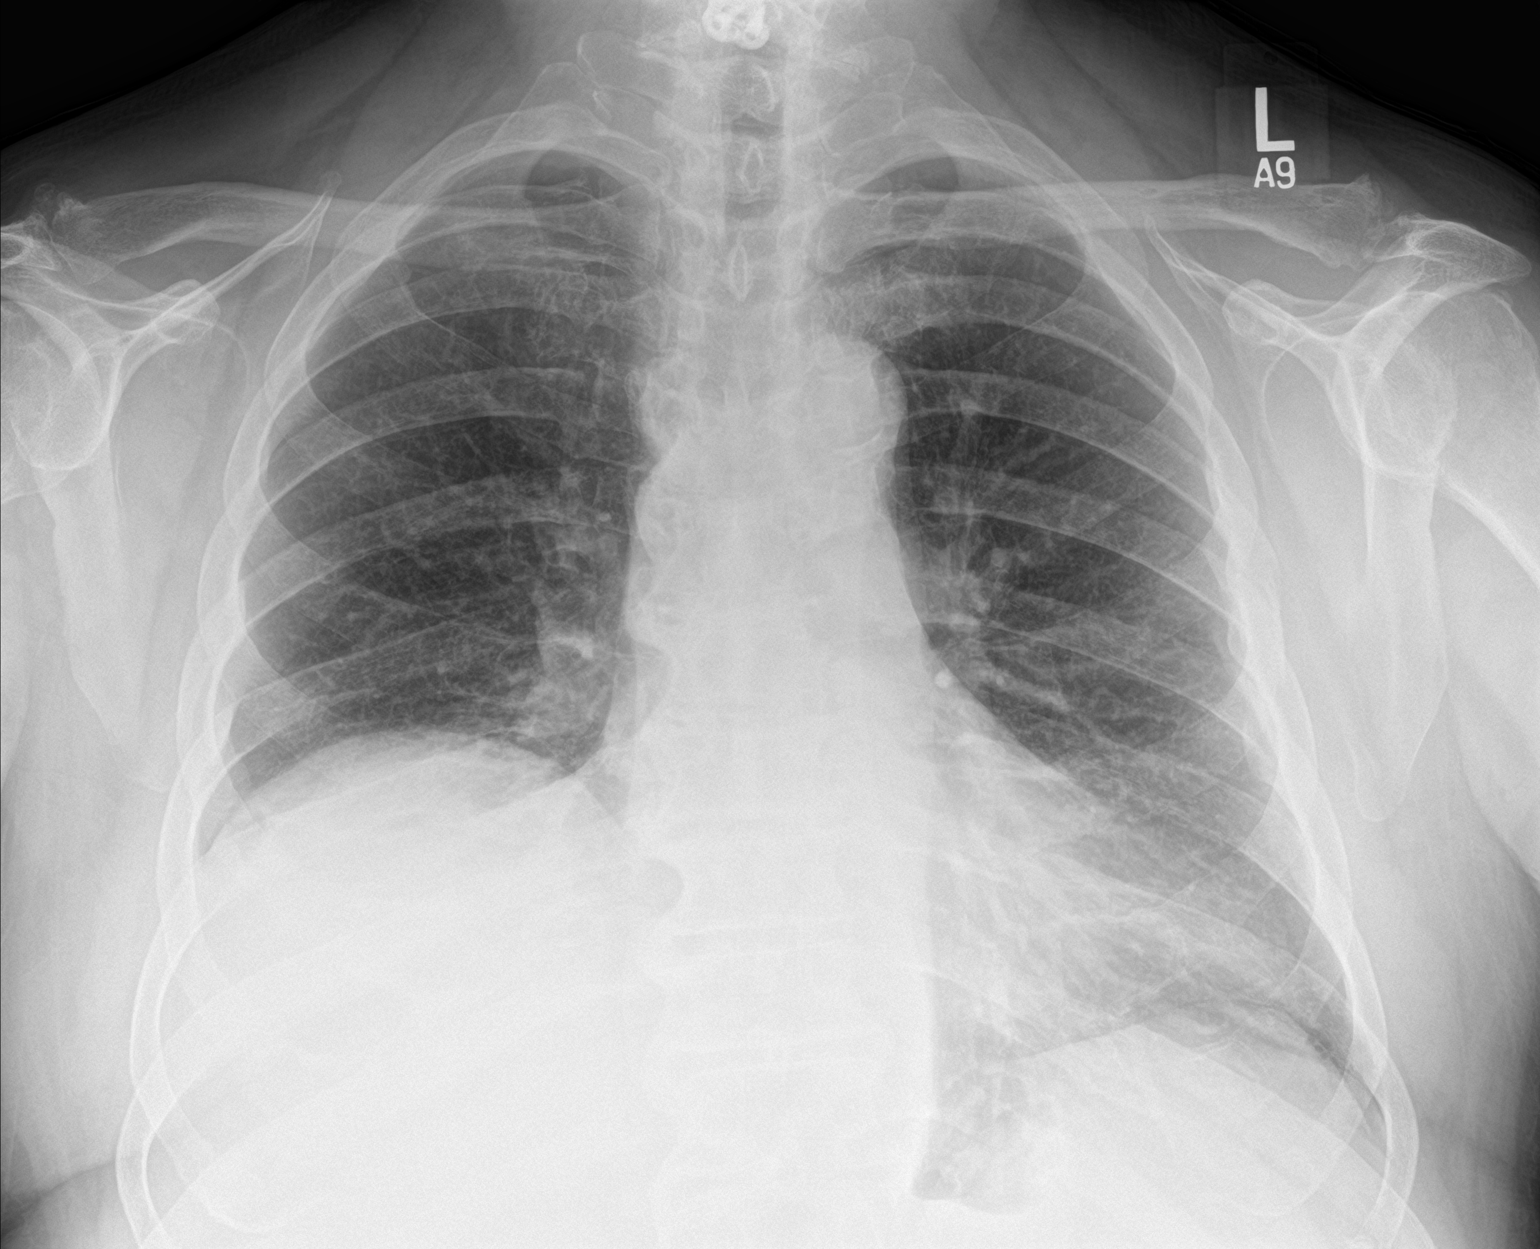

[chest lat]
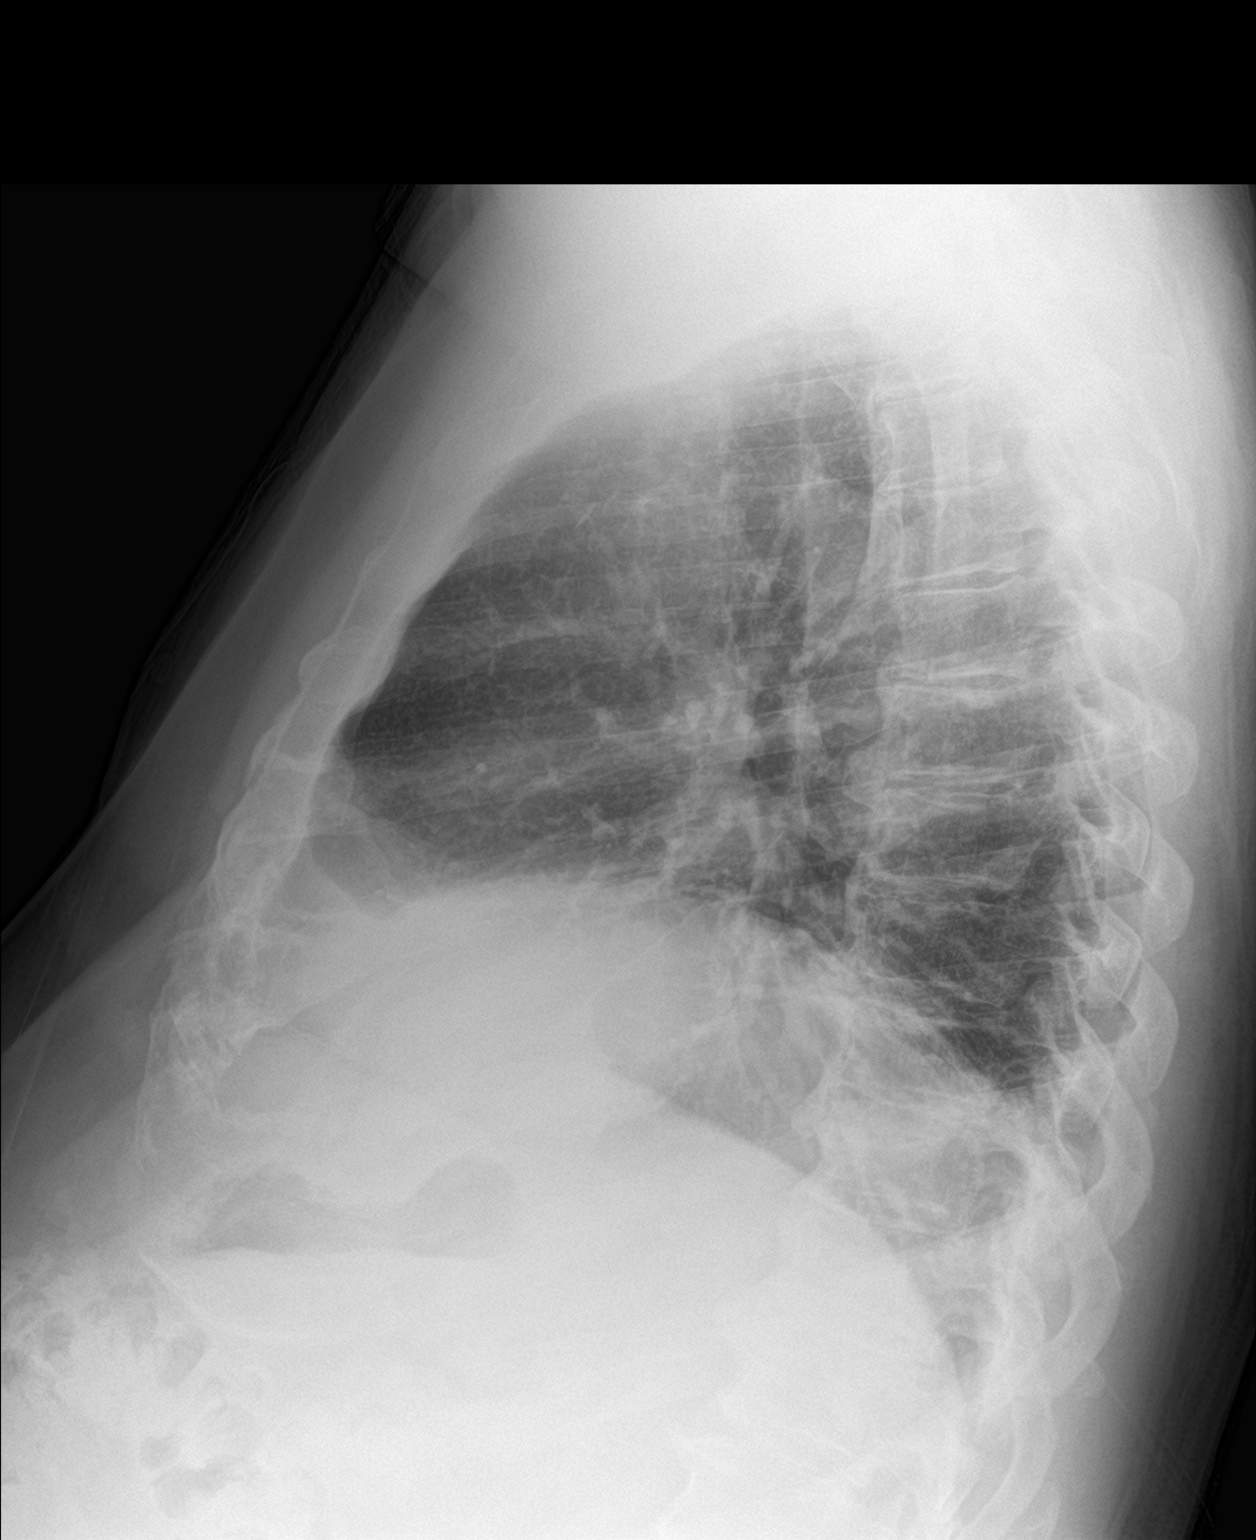

[2 of 2 positions shown; findings below may reference images not displayed]

FINDINGS: There elevation of the right diaphragm. There is mild left basilar
atelectasis. There is no focal parenchymal opacity. There is no
pleural effusion or pneumothorax. The heart and mediastinal contours
are unremarkable.

The osseous structures are unremarkable.
IMPRESSION: No active cardiopulmonary disease.

## 2021-02-22 ENCOUNTER — Encounter: Payer: Self-pay | Admitting: Urology

## 2021-02-22 ENCOUNTER — Ambulatory Visit (INDEPENDENT_AMBULATORY_CARE_PROVIDER_SITE_OTHER): Payer: Medicare HMO | Admitting: Urology

## 2021-02-22 ENCOUNTER — Other Ambulatory Visit: Payer: Self-pay

## 2021-02-22 VITALS — BP 163/71 | HR 63 | Ht 69.0 in | Wt 211.0 lb

## 2021-02-22 DIAGNOSIS — N529 Male erectile dysfunction, unspecified: Secondary | ICD-10-CM

## 2021-02-22 DIAGNOSIS — Z79818 Long term (current) use of other agents affecting estrogen receptors and estrogen levels: Secondary | ICD-10-CM

## 2021-02-22 DIAGNOSIS — C772 Secondary and unspecified malignant neoplasm of intra-abdominal lymph nodes: Secondary | ICD-10-CM

## 2021-02-22 DIAGNOSIS — IMO0001 Reserved for inherently not codable concepts without codable children: Secondary | ICD-10-CM

## 2021-02-22 DIAGNOSIS — C61 Malignant neoplasm of prostate: Secondary | ICD-10-CM | POA: Diagnosis not present

## 2021-02-22 DIAGNOSIS — R350 Frequency of micturition: Secondary | ICD-10-CM

## 2021-02-22 LAB — URINALYSIS, ROUTINE W REFLEX MICROSCOPIC
Bilirubin, UA: NEGATIVE
Glucose, UA: NEGATIVE
Ketones, UA: NEGATIVE
Leukocytes,UA: NEGATIVE
Nitrite, UA: NEGATIVE
Protein,UA: NEGATIVE
RBC, UA: NEGATIVE
Specific Gravity, UA: 1.02 (ref 1.005–1.030)
Urobilinogen, Ur: 0.2 mg/dL (ref 0.2–1.0)
pH, UA: 7 (ref 5.0–7.5)

## 2021-02-22 MED ORDER — SOLIFENACIN SUCCINATE 10 MG PO TABS: 10.0000 mg | ORAL_TABLET | Freq: Every day | ORAL | 3 refills | Status: DC

## 2021-02-22 NOTE — Progress Notes (Signed)
Urological Symptom Review  Patient is experiencing the following symptoms: Frequent urination Hard to postpone urination Get up at night to urinate Leakage of urine Stream starts and stops Injury to kidneys/bladder Erection problems (male only)   Review of Systems  Gastrointestinal (upper)  : Negative for upper GI symptoms  Gastrointestinal (lower) : Negative for lower GI symptoms  Constitutional : Negative for symptoms  Skin: Negative for skin symptoms  Eyes: Negative for eye symptoms  Ear/Nose/Throat : Negative for Ear/Nose/Throat symptoms  Hematologic/Lymphatic: Negative for Hematologic/Lymphatic symptoms  Cardiovascular : Negative for cardiovascular symptoms  Respiratory : Shortness of breath  Endocrine: Negative for endocrine symptoms  Musculoskeletal: Negative for musculoskeletal symptoms  Neurological: Negative for neurological symptoms  Psychologic: Anxiety

## 2021-02-22 NOTE — Progress Notes (Signed)
Assessment: 1. Prostate cancer ([place])   2. Urinary frequency   3. 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 Androgen deprivation therapy   5. Organic impotence     Plan: I reviewed his records from [facility] including prior labs and imaging studies as well as recent office notes. Continue ADT. Next dose due after Jun 26, 2021 Continue daily calcium and vit D. Refill Vesicare 10 mg daily PSA in 1 month. Will call with results. I do not think that his left LE pain is related to his prostate cancer and agree with plans for orthopedic evaluation.    Chief Complaint: Chief Complaint  Patient presents with   Prostate Cancer     HPI: Juan Raymond is a 80 y.o. male who presents for continued evaluation of prostate cancer .  He was diagnosed with low risk prostate cancer in 2011. Biopsy showed Gleason 6 adenocarcinoma on the left. Pre-treatment PSA was 12.93. Treated with interstitial brachytherapy in 8/11. His PSA nadired at 0.38 at 24 months. His PSA gradually increased.  PSA results as follows: 0.71 at 26 months 0.80 at 31 months 1.23 at 34 months 1.80 at 39 months 2.54 at 43 months 2.77 at 46 months 3.61 at 53 months (12/15)   He underwent a prostate biopsy on 11/22/13. TRUS volume: 30 ml Path: no malignancy No problems following biopsy. Bone scan from 06/17/14 was negative for skeletal mets. PSA: 3.53 5/16 4.31 7/16 5.12 9/16 5.57 1/17 8.84 9/18  Bone scan from 03/23/17 showed no evidence of metastatic disease. He underwent TRUS/BX on 04/21/27. Pathology negative for malignancy.  PET CT from 05/11/17 showed focal radiotracer accumulation in left prostate consistent with recurrence, no evidence of metastatic disease.  He received a 1 month Lupron injection on 06/09/17.  He received a 4 month Lupron injection on 07/10/17. PSA from 8/19 was 2.22. PSA from 9/20 was 8.15. PSA from 12/20 was 7.23 He received a 61-monthTrelstar injection in 12/20. He had  not returned for follow-up until 6/22. PSA from 6/22:  10.70 PSMA PET CT 7/22:  mild tracer activity in left side of prostate, 2 enlarged central mesenteric lymph nodes, no evidence of skeletal mets. He received a 6 month Lupron injection on 12/28/20. He has had fatigue and decreased libido since receiving the Lupron.  Occasional hot flashes.  History of gross hematuria. He was evaluated for an episode of gross hematuria in December 2014. CT showed no renal masses or obstruction. A pulmonary nodule was identified. Cystoscopy was negative. Cytology showed atypical cells.   He has a history of erectile dysfunction with problems with maintaining his erections. No pain or curvature with erection. No decrease in his libido. He has tried sildenafil 100 mg without improvement.  He has a history of lower urinary tract symptoms with urgency and urge incontinence. He voids with a good stream. No dysuria or gross hematuria. AUA score = 17 at prior visit. He was given a trial of Myrbetriq and noted improvement in his symptoms. He requested an alternative to Myrbetriq due to cost. He was changed to tolterodine LA but never took medication. He was started on Vesicare at his visit in 12/20. He continued on Vesicare with good control of his urinary symptoms. He has noted some worsening of his LUTS after running out of Vesicare recently.   No dysuria or gross hematuria.  AUA score = 13 today. He reports pain in his left upper thigh area making ambulation difficult.  He was recently  seen in the ED and is scheduled to see orthopedics next week.    Portions of the above documentation were copied from a prior visit for review purposes only.   Allergies  Allergen Reactions   Penicillins Anaphylaxis   Penicillin G Swelling   Statins Other (See Comments)    Generalized Pain    Tramadol     Other reaction(s): HALLUCINATIONS     Past Medical History:  Diagnosis Date   Arthritis    Cancer ([place])    Diabetes  mellitus without complication ([place])    Hypertension    Prostate cancer ([place])    Stroke ([place])    Vision abnormalities      Past Surgical History:  Procedure Laterality Date   APPENDECTOMY     BACK SURGERY     CATARACT EXTRACTION Bilateral 2014   EYE SURGERY     NECK SURGERY  2010   TONSILLECTOMY       Social History   Tobacco Use   Smoking status: Former    Packs/day: 1.00    Years: 40.00    Pack years: 40.00    Types: Cigarettes    Quit date: 06/06/2013    Years since quitting: 7.7   Smokeless tobacco: Never  Substance Use Topics   Alcohol use: No   Drug use: No    ROS: Constitutional:  Negative for fever, chills, weight loss CV: Negative for chest pain, previous MI, hypertension Respiratory:  Negative for shortness of breath, wheezing, sleep apnea, frequent cough GI:  Negative for nausea, vomiting, bloody stool, GERD  PE: BP (!) 163/71   Pulse 63   Ht '5\' 9"'$  (1.753 m)   Wt 95.7 kg   BMI 31.16 kg/m  GENERAL APPEARANCE:  Well appearing, well developed, well nourished, NAD HEENT:  Atraumatic, normocephalic, oropharynx clear NECK:  Supple without lymphadenopathy or thyromegaly ABDOMEN:  Soft, non-tender, no masses EXTREMITIES:  Moves all extremities well, without clubbing, cyanosis, or edema NEUROLOGIC:  Alert and oriented x 3, normal gait, CN II-XII grossly intact MENTAL STATUS:  appropriate BACK:  Non-tender to palpation, No CVAT SKIN:  Warm, dry, and intact    Results: Results for orders placed or performed in visit on 02/22/21 (from the past 24 hour(s))  Urinalysis, Routine w reflex microscopic   Collection Time: 02/22/21  2:47 PM  Result Value Ref Range   Specific Gravity, UA 1.020 1.005 - 1.030   pH, UA 7.0 5.0 - 7.5   Color, UA Yellow Yellow   Appearance Ur Clear Clear   Leukocytes,UA Negative Negative   Protein,UA Negative Negative/Trace   Glucose, UA Negative Negative   Ketones, UA Negative Negative   RBC, UA Negative Negative   Bilirubin,  UA Negative Negative   Urobilinogen, Ur 0.2 0.2 - 1.0 mg/dL   Nitrite, UA Negative Negative   Microscopic Examination Comment

## 2021-03-08 ENCOUNTER — Other Ambulatory Visit: Payer: Self-pay

## 2021-03-08 ENCOUNTER — Telehealth: Payer: Self-pay

## 2021-03-08 DIAGNOSIS — R350 Frequency of micturition: Secondary | ICD-10-CM

## 2021-03-08 MED ORDER — SOLIFENACIN SUCCINATE 10 MG PO TABS: 10.0000 mg | ORAL_TABLET | Freq: Every day | ORAL | 3 refills | Status: DC

## 2021-03-08 NOTE — Telephone Encounter (Signed)
Refill submitted to pharmacy listed.

## 2021-03-08 NOTE — Telephone Encounter (Signed)
Patient called [facility] and they are telling them they haven't received the   solifenacin (VESICARE) 10 MG tablet  Please call [facility] and resend Rx request.  Thanks, Helene Kelp

## 2021-03-09 NOTE — Telephone Encounter (Signed)
Left a Voice Mail:  Patient calling back to talk with nurse about medication.  Please call back at:  [phone].  Thanks, Helene Kelp

## 2021-03-16 NOTE — Telephone Encounter (Signed)
Spoke with wife- patient had not been taking medication as prescribed. Patient was forgetting to take medication. Wife has fixed his medication for him so that he doesn't forget. Reports still having urinary symptoms of frequency. Wife aware this will take some time of the medication as he had not been taking correctly. Voiced understanding.

## 2021-04-08 LAB — PSA: Prostate Specific Ag, Serum: 2 ng/mL (ref 0.0–4.0)

## 2021-04-13 ENCOUNTER — Telehealth: Payer: Self-pay

## 2021-04-13 NOTE — Telephone Encounter (Signed)
Received call from wife stating that the medication prescribed is not helping with his urinary frequency/ incontinence. Wife states that patient is going through multiple briefs and pads daily. Wife also request a prescription for Viagra for her husband.

## 2021-04-14 ENCOUNTER — Other Ambulatory Visit: Payer: Self-pay | Admitting: Urology

## 2021-04-14 DIAGNOSIS — N529 Male erectile dysfunction, unspecified: Secondary | ICD-10-CM

## 2021-04-14 DIAGNOSIS — R350 Frequency of micturition: Secondary | ICD-10-CM

## 2021-04-14 MED ORDER — FESOTERODINE FUMARATE ER 8 MG PO TB24: 8.0000 mg | ORAL_TABLET | Freq: Every day | ORAL | 11 refills | Status: DC

## 2021-04-14 MED ORDER — SILDENAFIL CITRATE 20 MG PO TABS: ORAL_TABLET | ORAL | 11 refills | Status: DC

## 2021-04-15 NOTE — Telephone Encounter (Signed)
Mrs. Wolgamott called with no answer. Message left.

## 2021-05-03 ENCOUNTER — Telehealth: Payer: Self-pay

## 2021-05-03 NOTE — Telephone Encounter (Signed)
Patient called with increase nighttime urinary frequency- appt offered this week with [facility] to see MD. Patient accepted.

## 2021-05-06 ENCOUNTER — Ambulatory Visit (INDEPENDENT_AMBULATORY_CARE_PROVIDER_SITE_OTHER): Payer: Medicare HMO | Admitting: Urology

## 2021-05-06 ENCOUNTER — Other Ambulatory Visit: Payer: Self-pay

## 2021-05-06 ENCOUNTER — Encounter: Payer: Self-pay | Admitting: Urology

## 2021-05-06 VITALS — BP 174/78 | HR 66

## 2021-05-06 DIAGNOSIS — C61 Malignant neoplasm of prostate: Secondary | ICD-10-CM

## 2021-05-06 DIAGNOSIS — Z79818 Long term (current) use of other agents affecting estrogen receptors and estrogen levels: Secondary | ICD-10-CM | POA: Diagnosis not present

## 2021-05-06 DIAGNOSIS — IMO0001 Reserved for inherently not codable concepts without codable children: Secondary | ICD-10-CM

## 2021-05-06 DIAGNOSIS — C772 Secondary and unspecified malignant neoplasm of intra-abdominal lymph nodes: Secondary | ICD-10-CM

## 2021-05-06 DIAGNOSIS — R350 Frequency of micturition: Secondary | ICD-10-CM

## 2021-05-06 DIAGNOSIS — N529 Male erectile dysfunction, unspecified: Secondary | ICD-10-CM

## 2021-05-06 LAB — MICROSCOPIC EXAMINATION
Epithelial Cells (non renal): NONE SEEN /hpf (ref 0–10)
RBC, Urine: NONE SEEN /hpf (ref 0–2)
Renal Epithel, UA: NONE SEEN /hpf
WBC, UA: NONE SEEN /hpf (ref 0–5)

## 2021-05-06 LAB — URINALYSIS, ROUTINE W REFLEX MICROSCOPIC
Bilirubin, UA: NEGATIVE
Glucose, UA: NEGATIVE
Ketones, UA: NEGATIVE
Leukocytes,UA: NEGATIVE
Nitrite, UA: NEGATIVE
RBC, UA: NEGATIVE
Specific Gravity, UA: 1.02 (ref 1.005–1.030)
Urobilinogen, Ur: 1 mg/dL (ref 0.2–1.0)
pH, UA: 6.5 (ref 5.0–7.5)

## 2021-05-06 LAB — BLADDER SCAN AMB NON-IMAGING: Scan Result: 85

## 2021-05-06 NOTE — Progress Notes (Signed)
Assessment: 1. Prostate cancer ([place])   2. 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 Androgen deprivation therapy   4. Urinary frequency   5. Organic impotence     Plan: Continue ADT. Next dose due after Jun 26, 2021 Continue daily calcium and vit D. Continue Toviaz 8 mg qHS.  Recommended taking this in the evening instead of in the morning since his symptoms are primarily during the nighttime hours. Voiding diary Return to office after 06/26/21 for Lupron Discussed addition of apalutamide to ADT for management of metastatic castrate sensitive prostate cancer  Chief Complaint: Chief Complaint  Patient presents with   Urinary Frequency   HPI: Juan Raymond is a 80 y.o. male who presents for continued evaluation of prostate cancer and urinary frequency .  He was diagnosed with low risk prostate cancer in 2011. Biopsy showed Gleason 6 adenocarcinoma on the left. Pre-treatment PSA was 12.93. Treated with interstitial brachytherapy in 8/11. His PSA nadired at 0.38 at 24 months. His PSA gradually increased.  PSA results as follows: 0.71 at 26 months 0.80 at 31 months 1.23 at 34 months 1.80 at 39 months 2.54 at 43 months 2.77 at 46 months 3.61 at 53 months (12/15)   He underwent a prostate biopsy on 11/22/13. TRUS volume: 30 ml Path: no malignancy No problems following biopsy. Bone scan from 06/17/14 was negative for skeletal mets. PSA: 3.53 5/16 4.31 7/16 5.12 9/16 5.57 1/17 8.84 9/18  Bone scan from 03/23/17 showed no evidence of metastatic disease. He underwent TRUS/BX on 04/21/27. Pathology negative for malignancy.  PET CT from 05/11/17 showed focal radiotracer accumulation in left prostate consistent with recurrence, no evidence of metastatic disease.  He received a 1 month Lupron injection on 06/09/17.  He received a 4 month Lupron injection on 07/10/17. PSA from 8/19 was 2.22. PSA from 9/20 was 8.15. PSA from 12/20 was 7.23 He received a 62-month  Trelstar injection in 12/20. He had not returned for follow-up until 6/22. PSA from 6/22:  10.70 PSMA PET CT 7/22:  mild tracer activity in left side of prostate, 2 enlarged central mesenteric lymph nodes, no evidence of skeletal mets. He received a 6 month Lupron injection on 12/28/20. He has had fatigue and decreased libido since receiving the Lupron.  Occasional hot flashes. PSA 11/22  2.0  History of gross hematuria. He was evaluated for an episode of gross hematuria in December 2014. CT showed no renal masses or obstruction. A pulmonary nodule was identified. Cystoscopy was negative. Cytology showed atypical cells.   He has a history of erectile dysfunction with problems with maintaining his erections. No pain or curvature with erection. No decrease in his libido. He has tried sildenafil 100 mg without improvement.  He has a history of lower urinary tract symptoms with urgency and urge incontinence. He voids with a good stream. No dysuria or gross hematuria. AUA score = 17 at prior visit. He was given a trial of Myrbetriq and noted improvement in his symptoms. He requested an alternative to Myrbetriq due to cost. He was changed to tolterodine LA but never took medication. He was started on Vesicare at his visit in 12/20. He continued on Vesicare with good control of his urinary symptoms. He noted some worsening of his LUTS after running out of Vesicare.  No dysuria or gross hematuria.  AUA score = 13.  He returns today for follow-up.  He has been changed to Toviaz 8 mg daily since his last visit.  He did not feel like the Vesicare was controlling his symptoms.  He has been taking the Toviaz in the morning.  He reports nocturia 2-6 times.  He is also having urgency and incontinence, primarily at night.  No dysuria or gross hematuria.  He reports improvement in his daytime symptoms. AUA score = 15 today.   Portions of the above documentation were copied from a prior visit for review purposes  only.   Allergies  Allergen Reactions   Penicillins Anaphylaxis   Penicillin G Swelling   Statins Other (See Comments)    Generalized Pain    Tramadol     Other reaction(s): HALLUCINATIONS     Past Medical History:  Diagnosis Date   Arthritis    Cancer ([place])    Diabetes mellitus without complication ([place])    Hypertension    Prostate cancer ([place])    Stroke ([place])    Vision abnormalities      Past Surgical History:  Procedure Laterality Date   APPENDECTOMY     BACK SURGERY     CATARACT EXTRACTION Bilateral 2014   EYE SURGERY     NECK SURGERY  2010   TONSILLECTOMY       Social History   Tobacco Use   Smoking status: Former    Packs/day: 1.00    Years: 40.00    Pack years: 40.00    Types: Cigarettes    Quit date: 06/06/2013    Years since quitting: 7.9   Smokeless tobacco: Never  Substance Use Topics   Alcohol use: No   Drug use: No    ROS: Constitutional:  Negative for fever, chills, weight loss CV: Negative for chest pain, previous MI, hypertension Respiratory:  Negative for shortness of breath, wheezing, sleep apnea, frequent cough GI:  Negative for nausea, vomiting, bloody stool, GERD  PE: BP (!) 174/78   Pulse 66  GENERAL APPEARANCE:  Well appearing, well developed, well nourished, NAD HEENT:  Atraumatic, normocephalic, oropharynx clear NECK:  Supple without lymphadenopathy or thyromegaly ABDOMEN:  Soft, non-tender, no masses EXTREMITIES:  Moves all extremities well, without clubbing, cyanosis, or edema NEUROLOGIC:  Alert and oriented x 3, normal gait, CN II-XII grossly intact MENTAL STATUS:  appropriate BACK:  Non-tender to palpation, No CVAT SKIN:  Warm, dry, and intact   Results: U/A:  few bacteria  Results for orders placed or performed in visit on 05/06/21 (from the past 24 hour(s))  BLADDER SCAN AMB NON-IMAGING   Collection Time: 05/06/21  3:06 PM  Result Value Ref Range   Scan Result 85 ml   Microscopic Examination   Collection  Time: 05/06/21  3:27 PM   Urine  Result Value Ref Range   WBC, UA None seen 0 - 5 /hpf   RBC None seen 0 - 2 /hpf   Epithelial Cells (non renal) None seen 0 - 10 /hpf   Renal Epithel, UA None seen None seen /hpf   Mucus, UA Present Not Estab.   Bacteria, UA Few None seen/Few  Urinalysis, Routine w reflex microscopic   Collection Time: 05/06/21  3:27 PM  Result Value Ref Range   Specific Gravity, UA 1.020 1.005 - 1.030   pH, UA 6.5 5.0 - 7.5   Color, UA Yellow Yellow   Appearance Ur Clear Clear   Leukocytes,UA Negative Negative   Protein,UA 2+ (A) Negative/Trace   Glucose, UA Negative Negative   Ketones, UA Negative Negative   RBC, UA Negative Negative   Bilirubin, UA Negative Negative  Urobilinogen, Ur 1.0 0.2 - 1.0 mg/dL   Nitrite, UA Negative Negative   Microscopic Examination See below:

## 2021-05-06 NOTE — Progress Notes (Signed)
scaUrological Symptom Review  Patient is experiencing the following symptoms: Frequent urination Hard to postpone urination Get up at night to urinate Leakage of urine Trouble starting stream   Review of Systems  Gastrointestinal (upper)  : Negative for upper GI symptoms  Gastrointestinal (lower) : Negative for lower GI symptoms  Constitutional : Negative for symptoms  Skin: Negative for skin symptoms  Eyes: Negative for eye symptoms  Ear/Nose/Throat : Negative for Ear/Nose/Throat symptoms  Hematologic/Lymphatic: Negative for Hematologic/Lymphatic symptoms  Cardiovascular : Negative for cardiovascular symptoms  Respiratory : Negative for respiratory symptoms  Endocrine: Negative for endocrine symptoms  Musculoskeletal: Negative for musculoskeletal symptoms  Neurological: Negative for neurological symptoms  Psychologic: Negative for psychiatric symptoms

## 2021-06-14 ENCOUNTER — Other Ambulatory Visit: Payer: Self-pay | Admitting: Urology

## 2021-06-14 DIAGNOSIS — R350 Frequency of micturition: Secondary | ICD-10-CM

## 2021-06-22 ENCOUNTER — Other Ambulatory Visit: Payer: Self-pay | Admitting: Physician Assistant

## 2021-06-22 DIAGNOSIS — R350 Frequency of micturition: Secondary | ICD-10-CM

## 2021-06-22 MED ORDER — FESOTERODINE FUMARATE ER 8 MG PO TB24: 8.0000 mg | ORAL_TABLET | Freq: Every day | ORAL | 11 refills | Status: DC

## 2021-06-28 ENCOUNTER — Ambulatory Visit (INDEPENDENT_AMBULATORY_CARE_PROVIDER_SITE_OTHER): Payer: Medicare HMO | Admitting: Urology

## 2021-06-28 ENCOUNTER — Other Ambulatory Visit: Payer: Self-pay

## 2021-06-28 ENCOUNTER — Encounter: Payer: Self-pay | Admitting: Urology

## 2021-06-28 VITALS — BP 139/69 | HR 56 | Wt 211.0 lb

## 2021-06-28 DIAGNOSIS — C61 Malignant neoplasm of prostate: Secondary | ICD-10-CM

## 2021-06-28 DIAGNOSIS — Z79818 Long term (current) use of other agents affecting estrogen receptors and estrogen levels: Secondary | ICD-10-CM | POA: Diagnosis not present

## 2021-06-28 DIAGNOSIS — N529 Male erectile dysfunction, unspecified: Secondary | ICD-10-CM | POA: Diagnosis not present

## 2021-06-28 DIAGNOSIS — R350 Frequency of micturition: Secondary | ICD-10-CM

## 2021-06-28 DIAGNOSIS — C772 Secondary and unspecified malignant neoplasm of intra-abdominal lymph nodes: Secondary | ICD-10-CM

## 2021-06-28 LAB — URINALYSIS, ROUTINE W REFLEX MICROSCOPIC
Bilirubin, UA: NEGATIVE
Glucose, UA: NEGATIVE
Ketones, UA: NEGATIVE
Leukocytes,UA: NEGATIVE
Nitrite, UA: NEGATIVE
Protein,UA: NEGATIVE
RBC, UA: NEGATIVE
Specific Gravity, UA: 1.025 (ref 1.005–1.030)
Urobilinogen, Ur: 0.2 mg/dL (ref 0.2–1.0)
pH, UA: 5.5 (ref 5.0–7.5)

## 2021-06-28 MED ORDER — LEUPROLIDE ACETATE (6 MONTH) 45 MG ~~LOC~~ KIT
45.0000 mg | PACK | Freq: Once | SUBCUTANEOUS | Status: AC
  Administered 2021-06-28: 45 mg via SUBCUTANEOUS

## 2021-06-28 NOTE — Progress Notes (Deleted)
Urological Symptom Review  Patient is experiencing the following symptoms: Negativ   Review of Systems  Gastrointestinal (upper)  : Negative for upper GI symptoms  Gastrointestinal (lower) : Negative for lower GI symptoms  Constitutional : Negative for symptoms  Skin: Negative for skin symptoms  Eyes: Negative for eye symptoms  Ear/Nose/Throat : Negative for Ear/Nose/Throat symptoms  Hematologic/Lymphatic: Negative for Hematologic/Lymphatic symptoms  Cardiovascular : Negative for cardiovascular symptoms  Respiratory : Negative for respiratory symptoms  Endocrine: Negative for endocrine symptoms  Musculoskeletal: Negative for musculoskeletal symptoms  Neurological: Negative for neurological symptoms  Psychologic: Negative for psychiatric symptoms

## 2021-06-28 NOTE — Progress Notes (Signed)
Urological Symptom Review  Patient is experiencing the following symptoms: Frequent urination Burning/pain with urination Leakage of urine Weak stream   Review of Systems  Gastrointestinal (upper)  : Negative for upper GI symptoms  Gastrointestinal (lower) : Constipation  Constitutional : Negative for symptoms  Skin: Negative for skin symptoms  Eyes: Blurred vision  Ear/Nose/Throat : Negative for Ear/Nose/Throat symptoms  Hematologic/Lymphatic: Easy bruising  Cardiovascular : Negative for cardiovascular symptoms  Respiratory : Negative for respiratory symptoms  Endocrine: Negative for endocrine symptoms  Musculoskeletal: Negative for musculoskeletal symptoms  Neurological: Negative for neurological symptoms  Psychologic: Negative for psychiatric symptoms

## 2021-06-28 NOTE — Progress Notes (Signed)
Assessment: 1. Prostate cancer ([place])   2. Urinary frequency   3. 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 Androgen deprivation therapy   5. Organic impotence     Plan: PSA, testosterone today Continue ADT.  Eligard 6 month depot given today Continue daily calcium and vit D. Bone density study ordered Continue Vesicare 10 mg qHS  He will mail his voiding diary Discussed addition of apalutamide to ADT for management of metastatic castrate sensitive prostate cancer Return to office in 2 months  Chief Complaint: Chief Complaint  Patient presents with   Prostate Cancer   HPI: Juan Raymond is a 81 y.o. male who presents for continued evaluation of metastatic castrate sensitive  prostate cancer and urinary frequency .  He was diagnosed with low risk prostate cancer in 2011. Biopsy showed Gleason 6 adenocarcinoma on the left. Pre-treatment PSA was 12.93. Treated with interstitial brachytherapy in 8/11. His PSA nadired at 0.38 at 24 months. His PSA gradually increased.  PSA results as follows: 0.71 at 26 months 0.80 at 31 months 1.23 at 34 months 1.80 at 39 months 2.54 at 43 months 2.77 at 46 months 3.61 at 53 months (12/15)   He underwent a prostate biopsy on 11/22/13. TRUS volume: 30 ml Path: no malignancy No problems following biopsy. Bone scan from 06/17/14 was negative for skeletal mets. PSA: 3.53 5/16 4.31 7/16 5.12 9/16 5.57 1/17 8.84 9/18  Bone scan from 03/23/17 showed no evidence of metastatic disease. He underwent TRUS/BX on 04/21/27. Pathology negative for malignancy.  PET CT from 05/11/17 showed focal radiotracer accumulation in left prostate consistent with recurrence, no evidence of metastatic disease.  He received a 1 month Lupron injection on 06/09/17.  He received a 4 month Lupron injection on 07/10/17. PSA from 8/19 was 2.22. PSA from 9/20 was 8.15. PSA from 12/20 was 7.23 He received a 54-month Trelstar injection in 12/20. He had  not returned for follow-up until 6/22. PSA from 6/22:  10.70 PSMA PET CT 7/22:  mild tracer activity in left side of prostate, 2 enlarged central mesenteric lymph nodes, no evidence of skeletal mets. He received a 6 month Lupron injection on 12/28/20. He has had fatigue and decreased libido since receiving the Lupron.  Occasional hot flashes. PSA 11/22  2.0 PSA 12/22  1.4  History of gross hematuria. He was evaluated for an episode of gross hematuria in December 2014. CT showed no renal masses or obstruction. A pulmonary nodule was identified. Cystoscopy was negative. Cytology showed atypical cells.   He has a history of erectile dysfunction with problems with maintaining his erections. No pain or curvature with erection. No decrease in his libido. He has tried sildenafil 100 mg without improvement.  He has a history of lower urinary tract symptoms with urgency and urge incontinence. He voids with a good stream. No dysuria or gross hematuria. AUA score = 17 at prior visit. He was given a trial of Myrbetriq and noted improvement in his symptoms. He requested an alternative to Myrbetriq due to cost. He was changed to tolterodine LA but never took medication. He was started on Vesicare at his visit in 12/20. He continued on Vesicare with good control of his urinary symptoms. He noted some worsening of his LUTS after running out of Vesicare.  No dysuria or gross hematuria.  AUA score = 13.  He was changed to Toviaz 8 mg daily as he did not feel like the Vesicare was controlling his symptoms.  He  reported nocturia 2-6 times, urgency and incontinence, primarily at night.  No dysuria or gross hematuria.  He reported improvement in his daytime symptoms. AUA score = 15.  He returns today for follow-up. He continues to have symptoms of frequency, nocturia 1-2 times, and incontinence with urgency and without awareness.  No dysuria or gross hematuria.  He is currently taking Vesicare rather than Toviaz. IPSS  = 19 today.   Portions of the above documentation were copied from a prior visit for review purposes only.   Allergies  Allergen Reactions   Penicillins Anaphylaxis   Penicillin G Swelling   Statins Other (See Comments)    Generalized Pain    Tramadol     Other reaction(s): HALLUCINATIONS     Past Medical History:  Diagnosis Date   Arthritis    Cancer ([place])    Diabetes mellitus without complication ([place])    Hypertension    Prostate cancer ([place])    Stroke ([place])    Vision abnormalities      Past Surgical History:  Procedure Laterality Date   APPENDECTOMY     BACK SURGERY     CATARACT EXTRACTION Bilateral 2014   EYE SURGERY     NECK SURGERY  2010   TONSILLECTOMY       Social History   Tobacco Use   Smoking status: Former    Packs/day: 1.00    Years: 40.00    Pack years: 40.00    Types: Cigarettes    Quit date: 06/06/2013    Years since quitting: 8.0   Smokeless tobacco: Never  Substance Use Topics   Alcohol use: No   Drug use: No    ROS: Constitutional:  Negative for fever, chills, weight loss CV: Negative for chest pain, previous MI, hypertension Respiratory:  Negative for shortness of breath, wheezing, sleep apnea, frequent cough GI:  Negative for nausea, vomiting, bloody stool, GERD  PE: BP 139/69    Pulse (!) 56    Wt 211 lb (95.7 kg)    BMI 31.16 kg/m  GENERAL APPEARANCE:  Well appearing, well developed, well nourished, NAD HEENT:  Atraumatic, normocephalic, oropharynx clear NECK:  Supple without lymphadenopathy or thyromegaly ABDOMEN:  Soft, non-tender, no masses EXTREMITIES:  Moves all extremities well, without clubbing, cyanosis, or edema NEUROLOGIC:  Alert and oriented x 3, normal gait, CN II-XII grossly intact MENTAL STATUS:  appropriate BACK:  Non-tender to palpation, No CVAT SKIN:  Warm, dry, and intact   Results: U/A: dipstick negative  PVR = 0 ml

## 2021-06-28 NOTE — Patient Instructions (Signed)
Solifenacin (Vesicare) Fesoterodine (Toviaz)

## 2021-06-28 NOTE — Progress Notes (Signed)
post void residual = 0 ml

## 2021-06-28 NOTE — Progress Notes (Signed)
Eligard SubQ Injection   Due to Prostate Cancer patient is present today for a Eligard Injection.  Medication: Eligard 6 month Dose: 45 mg  Location: right  Lot: [id number] Exp: 10/2022  Patient tolerated well, no complications were noted  Performed by: Janett Billow CMA

## 2021-06-29 ENCOUNTER — Telehealth: Payer: Self-pay

## 2021-06-29 LAB — TESTOSTERONE: Testosterone: <3 ng/dL — ABNORMAL LOW (ref 264–916)

## 2021-06-29 NOTE — Telephone Encounter (Signed)
Patient's wife calling to let Dr. Felipa Eth know the medication patient is taking. is  SOLIFENACIN 10 MG   Pt also wants to know what the status is on the bone deficiency test.  Please advise wife at: [phone]  Thanks, Helene Kelp

## 2021-06-29 NOTE — Telephone Encounter (Signed)
Reviewed with wife bone scan ordered yesterday and scheduling will reach out to patient to have this scheduled.  Per Dr. Felipa Eth office note- MD aware patient is taking medication

## 2021-06-30 LAB — SPECIMEN STATUS REPORT

## 2021-06-30 LAB — PSA: Prostate Specific Ag, Serum: 1.3 ng/mL (ref 0.0–4.0)

## 2021-07-01 ENCOUNTER — Telehealth: Payer: Self-pay

## 2021-07-01 NOTE — Telephone Encounter (Signed)
-----   Message from Primus Bravo, MD sent at 07/01/2021  8:43 AM EST ----- Please notify patient that his PSA is stable as compared to 12/22 and his testosterone level indicates the ADT is working well. We discussed the addition of another medication [facility]) for the treatment of his prostate cancer.  If he is interested in proceeding with this now, I can work on a prescription or we can discuss further at his visit in March.

## 2021-07-01 NOTE — Telephone Encounter (Signed)
Opened in error

## 2021-07-01 NOTE — Telephone Encounter (Signed)
Patient called and notified. Wishes to wait until march to discuss adding Erleada.

## 2021-07-05 ENCOUNTER — Inpatient Hospital Stay (HOSPITAL_BASED_OUTPATIENT_CLINIC_OR_DEPARTMENT_OTHER): Admission: RE | Admit: 2021-07-05 | Payer: Medicare Other | Source: Ambulatory Visit

## 2021-07-05 ENCOUNTER — Other Ambulatory Visit (HOSPITAL_BASED_OUTPATIENT_CLINIC_OR_DEPARTMENT_OTHER): Payer: Self-pay | Admitting: Urology

## 2021-07-05 DIAGNOSIS — R937 Abnormal findings on diagnostic imaging of other parts of musculoskeletal system: Secondary | ICD-10-CM

## 2021-07-05 DIAGNOSIS — Z79818 Long term (current) use of other agents affecting estrogen receptors and estrogen levels: Secondary | ICD-10-CM

## 2021-07-24 ENCOUNTER — Telehealth (HOSPITAL_BASED_OUTPATIENT_CLINIC_OR_DEPARTMENT_OTHER): Payer: Self-pay

## 2021-08-26 ENCOUNTER — Telehealth: Payer: Self-pay

## 2021-08-26 ENCOUNTER — Ambulatory Visit (INDEPENDENT_AMBULATORY_CARE_PROVIDER_SITE_OTHER): Payer: Medicare HMO | Admitting: Urology

## 2021-08-26 ENCOUNTER — Other Ambulatory Visit: Payer: Self-pay

## 2021-08-26 ENCOUNTER — Encounter: Payer: Self-pay | Admitting: Urology

## 2021-08-26 VITALS — BP 147/68 | HR 54

## 2021-08-26 DIAGNOSIS — C61 Malignant neoplasm of prostate: Secondary | ICD-10-CM

## 2021-08-26 DIAGNOSIS — N529 Male erectile dysfunction, unspecified: Secondary | ICD-10-CM

## 2021-08-26 DIAGNOSIS — Z79818 Long term (current) use of other agents affecting estrogen receptors and estrogen levels: Secondary | ICD-10-CM

## 2021-08-26 DIAGNOSIS — C772 Secondary and unspecified malignant neoplasm of intra-abdominal lymph nodes: Secondary | ICD-10-CM

## 2021-08-26 DIAGNOSIS — R350 Frequency of micturition: Secondary | ICD-10-CM | POA: Diagnosis not present

## 2021-08-26 LAB — URINALYSIS, ROUTINE W REFLEX MICROSCOPIC
Bilirubin, UA: NEGATIVE
Glucose, UA: NEGATIVE
Ketones, UA: NEGATIVE
Leukocytes,UA: NEGATIVE
Nitrite, UA: NEGATIVE
Protein,UA: NEGATIVE
RBC, UA: NEGATIVE
Specific Gravity, UA: 1.025 (ref 1.005–1.030)
Urobilinogen, Ur: 0.2 mg/dL (ref 0.2–1.0)
pH, UA: 5.5 (ref 5.0–7.5)

## 2021-08-26 LAB — MICROSCOPIC EXAMINATION
Bacteria, UA: NONE SEEN
Renal Epithel, UA: NONE SEEN /hpf
WBC, UA: NONE SEEN /hpf (ref 0–5)

## 2021-08-26 MED ORDER — APALUTAMIDE 60 MG PO TABS: 240.0000 mg | ORAL_TABLET | Freq: Every day | ORAL | 11 refills | Status: AC

## 2021-08-26 MED ORDER — TADALAFIL 20 MG PO TABS: 20.0000 mg | ORAL_TABLET | Freq: Every day | ORAL | 11 refills | Status: AC | PRN

## 2021-08-26 MED ORDER — SOLIFENACIN SUCCINATE 10 MG PO TABS: 10.0000 mg | ORAL_TABLET | Freq: Every day | ORAL | 11 refills | Status: AC

## 2021-08-26 NOTE — Progress Notes (Signed)
Assessment: ?1. 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 Androgen deprivation therapy   ?3. Organic impotence   ?4. Urinary frequency   ? ? ?Plan: ?Continue with ADT.  Last 61-monthEligard given on 06/28/2021.   ?Continue daily calcium and vit D. ?Bone density study ordered ?Continue Vesicare 10 mg qHS  ?Discussed addition of apalutamide to ADT for management of metastatic castrate sensitive prostate cancer.  He would like to start medication ?Begin apalutamide 240 mg daily.  Patient given 14 day starter kit. ?Return to office in 4 months (after December 31, 2021) ? ?Chief Complaint: ?Chief Complaint  ?Patient presents with  ? Prostate Cancer  ? ?HPI: ?Juan Wamboldis a 81y.o. male who presents for continued evaluation of metastatic castrate sensitive  prostate cancer and urinary frequency . ? ?He was diagnosed with low risk prostate cancer in 2011. Biopsy showed Gleason 6 adenocarcinoma on the left. Pre-treatment PSA was 12.93. Treated with interstitial brachytherapy in 8/11. His PSA nadired at 0.38 at 24 months. His PSA gradually increased.  ?PSA results as follows: ?0.71 at 26 months ?0.80 at 31 months ?1.23 at 34 months ?1.80 at 39 months ?2.54 at 43 months ?2.77 at 46 months ?3.61 at 53 months (12/15) ?  ?He underwent a prostate biopsy on 11/22/13. ?TRUS volume: 30 ml ?Path: no malignancy ?No problems following biopsy. ?Bone scan from 06/17/14 was negative for skeletal mets. ?PSA: 3.53 5/16 ?4.31 7/16 ?5.12 9/16 ?5.57 1/17 ?8.84 9/18 ? ?Bone scan from 03/23/17 showed no evidence of metastatic disease. ?He underwent TRUS/BX on 04/21/27. Pathology negative for malignancy.  ?PET CT from 05/11/17 showed focal radiotracer accumulation in left prostate consistent with recurrence, no evidence of metastatic disease.  ?He received a 1 month Lupron injection on 06/09/17.  ?He received a 4 month Lupron injection on 07/10/17. ?PSA from 8/19 was 2.22. ?PSA from 9/20 was 8.15. ?PSA from 12/20 was 7.23 ?He  received a 341-monthrelstar injection in 12/20. ?He had not returned for follow-up until 6/22. ?PSA from 6/22:  10.70 ?PSMA PET CT 7/22:  mild tracer activity in left side of prostate, 2 enlarged central mesenteric lymph nodes, no evidence of skeletal mets. ?He received a 6 month Lupron injection on 12/28/20. ?He has had fatigue and decreased libido since receiving the Lupron.  Occasional hot flashes. ?PSA 11/22  2.0 ?PSA 12/22  1.4 ?PSA 1/22     1.3     testosterone <3 ?He received a 6-87-monthigard injection on 06/28/2021. ? ?History of gross hematuria. He was evaluated for an episode of gross hematuria in December 2014. CT showed no renal masses or obstruction. A pulmonary nodule was identified. Cystoscopy was negative. Cytology showed atypical cells. ?  ?He has a history of erectile dysfunction with problems with maintaining his erections. No pain or curvature with erection. No decrease in his libido. He has tried sildenafil 100 mg without improvement. ? ?He has a history of lower urinary tract symptoms with urgency and urge incontinence. He voids with a good stream. No dysuria or gross hematuria. AUA score = 17 at prior visit. He was given a trial of Myrbetriq and noted improvement in his symptoms. He requested an alternative to Myrbetriq due to cost. He was changed to tolterodine LA but never took medication. He was started on Vesicare at his visit in 12/20. He continued on Vesicare with good control of his urinary symptoms. ?He noted some worsening of his LUTS after running out of Vesicare.  ?No  dysuria or gross hematuria.  AUA score = 13. ? ?He was changed to Toviaz 8 mg daily as he did not feel like the Vesicare was controlling his symptoms.  He reported nocturia 2-6 times, urgency and incontinence, primarily at night.  No dysuria or gross hematuria.  He reported improvement in his daytime symptoms. ?AUA score = 15. ? ?At his visit in 1/23, he continued to have symptoms of frequency, nocturia 1-2 times, and  incontinence with urgency and without awareness.  No dysuria or gross hematuria.  He is currently taking Vesicare rather than Toviaz. ?IPSS = 19.   PVR = 0 ml. ? ?He returns today for follow-up.  He continues to have some symptoms of frequency, nocturia 1-2 times, and occasional urgency with incontinence.  He is continuing on Vesicare.  No dysuria or gross hematuria. ?He continues to tolerate ADT well. ?He has not had his bone density study yet. ?IPSS = 12 today. ? ?Portions of the above documentation were copied from a prior visit for review purposes only. ? ? ?Allergies  ?Allergen Reactions  ? Penicillins Anaphylaxis  ? Penicillin G Swelling  ? Statins Other (See Comments)  ?  Generalized Pain   ? Tramadol   ?  Other reaction(s): HALLUCINATIONS  ? ? ? ?Past Medical History:  ?Diagnosis Date  ? Arthritis   ? Cancer [facility])   ? Diabetes mellitus without complication ([place])   ? Hypertension   ? Prostate cancer ([place])   ? Stroke [facility])   ? Vision abnormalities   ? ? ? ?Past Surgical History:  ?Procedure Laterality Date  ? APPENDECTOMY    ? BACK SURGERY    ? CATARACT EXTRACTION Bilateral 2014  ? EYE SURGERY    ? NECK SURGERY  2010  ? TONSILLECTOMY    ? ? ? ?Social History  ? ?Tobacco Use  ? Smoking status: Former  ?  Packs/day: 1.00  ?  Years: 40.00  ?  Pack years: 40.00  ?  Types: Cigarettes  ?  Quit date: 06/06/2013  ?  Years since quitting: 8.2  ? Smokeless tobacco: Never  ?Substance Use Topics  ? Alcohol use: No  ? Drug use: No  ? ? ?ROS: ?Constitutional:  Negative for fever, chills, weight loss ?CV: Negative for chest pain, previous MI, hypertension ?Respiratory:  Negative for shortness of breath, wheezing, sleep apnea, frequent cough ?GI:  Negative for nausea, vomiting, bloody stool, GERD ? ?PE: ?BP (!) 147/68   Pulse (!) 54  ?GENERAL APPEARANCE:  Well appearing, well developed, well nourished, NAD ?HEENT:  Atraumatic, normocephalic, oropharynx clear ?NECK:  Supple without lymphadenopathy or thyromegaly ?ABDOMEN:   Soft, non-tender, no masses ?EXTREMITIES:  Moves all extremities well, without clubbing, cyanosis, or edema ?NEUROLOGIC:  Alert and oriented x 3, normal gait, CN II-XII grossly intact ?MENTAL STATUS:  appropriate ?BACK:  Non-tender to palpation, No CVAT ?SKIN:  Warm, dry, and intact ? ? ?Results: ?U/A: 0-2 RBC ? ? ?

## 2021-08-26 NOTE — Telephone Encounter (Signed)
-----  Message from Primus Bravo, MD sent at 08/26/2021  2:11 PM EDT ----- ?Regarding: Erleada ?Please get patient set up with apalutamide 240 mg daily.  He was given a 14 day starter kit on 08/26/21. ? ? ? ?

## 2021-08-26 NOTE — Telephone Encounter (Signed)
Prescription sent to [facility] along with clinicals for PA to be completed. ? ?Patient was given 14 day sample pack if office by MD. ? ?Left message for patient to call back to office  ?

## 2021-09-01 ENCOUNTER — Telehealth: Payer: Self-pay

## 2021-09-01 NOTE — Telephone Encounter (Signed)
FYI patient just returning your call. ?

## 2021-09-09 ENCOUNTER — Other Ambulatory Visit (HOSPITAL_BASED_OUTPATIENT_CLINIC_OR_DEPARTMENT_OTHER): Payer: Medicare HMO

## 2021-09-11 NOTE — Telephone Encounter (Signed)
Spoke with patient regarding Erleada. ? ?Patient insurance is pending- requested information sent back to [facility]- okay to proceed per Dr. Felipa Eth with [facility] and eliquis. ? ? ?

## 2021-09-22 NOTE — Telephone Encounter (Signed)
Juan Raymond left message that patient has not answered any of there phone calls. ? ?Reached out to patient, patient stated he has not received any calls from [employer]. Number given of [phone]. ?Patient will reach out to number provided.  ?

## 2021-09-22 NOTE — Telephone Encounter (Signed)
Reached out to Raquel with [facility] and left voicemail to return call to check on status of Erleada. ? ?Another 14 day sample bottle left at front desk for patient as well to continue medication.,  ?

## 2021-09-27 NOTE — Telephone Encounter (Signed)
Erleada approved ?High co-pay [phone] Governor Specking is working with Sande Rives to have assistance provider) ?Per [facility] - they have been unable to reach patient.  ? ?Number for patient to reach out to Governor Specking is ?Main number [phone] ext [phone] ?Patient to call and ask for social worker.  ? ?I called patient and left voice mail to return call.  ?

## 2021-09-30 ENCOUNTER — Encounter: Payer: Self-pay | Admitting: Urology

## 2021-09-30 NOTE — Telephone Encounter (Signed)
Please advise-  ?Patient is not answering [facility] for [employer]. ?I have reached out to patient myself and last attempt no success to speak with patient.  ?

## 2021-09-30 NOTE — Telephone Encounter (Signed)
His my chart last access was in 2018 ?

## 2021-11-19 ENCOUNTER — Telehealth: Payer: Self-pay

## 2021-11-19 NOTE — Telephone Encounter (Signed)
Patient's wife called in regards to the patients treatment.  Informed her that we have been trying to reach them for several months now with instructions on reaching out to [employer].  Ms. Juan Raymond did not have a pen on her so she gave me the OK to leave a voicemail on her phone with the number and ext of the pharmacy.   Detailed message left as well as our call back number informing her to reach out on Monday if she had any further questions.

## 2022-01-06 ENCOUNTER — Ambulatory Visit: Payer: Medicare HMO | Admitting: Urology

## 2022-01-06 NOTE — Progress Notes (Deleted)
Assessment: 1. 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 Androgen deprivation therapy   3. Organic impotence   4. Urinary frequency      Plan: Continue with ADT.  42-monthEligard given today.   Continue daily calcium and vit D. Bone density study ordered Continue Vesicare 10 mg qHS  Continue apalutamide 240 mg daily.  Patient given 14 day starter kit. Return to office in 4 months (after December 31, 2021)  Chief Complaint: No chief complaint on file.  HPI: Juan Raymond a 81y.o. male who presents for continued evaluation of metastatic castrate sensitive  prostate cancer and urinary frequency .  He was diagnosed with low risk prostate cancer in 2011. Biopsy showed Gleason 6 adenocarcinoma on the left. Pre-treatment PSA was 12.93. Treated with interstitial brachytherapy in 8/11. His PSA nadired at 0.38 at 24 months. His PSA gradually increased.  PSA results as follows: 0.71 at 26 months 0.80 at 31 months 1.23 at 34 months 1.80 at 39 months 2.54 at 43 months 2.77 at 46 months 3.61 at 53 months (12/15)   He underwent a prostate biopsy on 11/22/13. TRUS volume: 30 ml Path: no malignancy No problems following biopsy. Bone scan from 06/17/14 was negative for skeletal mets. PSA: 3.53 5/16 4.31 7/16 5.12 9/16 5.57 1/17 8.84 9/18  Bone scan from 03/23/17 showed no evidence of metastatic disease. He underwent TRUS/BX on 04/21/27. Pathology negative for malignancy.  PET CT from 05/11/17 showed focal radiotracer accumulation in left prostate consistent with recurrence, no evidence of metastatic disease.  He received a 1 month Lupron injection on 06/09/17.  He received a 4 month Lupron injection on 07/10/17. PSA from 8/19 was 2.22. PSA from 9/20 was 8.15. PSA from 12/20 was 7.23 He received a 358-monthrelstar injection in 12/20. He had not returned for follow-up until 6/22. PSA from 6/22:  10.70 PSMA PET CT 7/22:  mild tracer activity in left side of  prostate, 2 enlarged central mesenteric lymph nodes, no evidence of skeletal mets. He received a 6 month Lupron injection on 12/28/20. He has had fatigue and decreased libido since receiving the Lupron.  Occasional hot flashes. PSA 11/22  2.0 PSA 12/22  1.4 PSA 1/22     1.3     testosterone <3 He received a 6-28-monthigard injection on 06/28/2021.  History of gross hematuria. He was evaluated for an episode of gross hematuria in December 2014. CT showed no renal masses or obstruction. A pulmonary nodule was identified. Cystoscopy was negative. Cytology showed atypical cells.   He has a history of erectile dysfunction with problems with maintaining his erections. No pain or curvature with erection. No decrease in his libido. He has tried sildenafil 100 mg without improvement.  He has a history of lower urinary tract symptoms with urgency and urge incontinence. He voids with a good stream. No dysuria or gross hematuria. AUA score = 17 at prior visit. He was given a trial of Myrbetriq and noted improvement in his symptoms. He requested an alternative to Myrbetriq due to cost. He was changed to tolterodine LA but never took medication. He was started on Vesicare at his visit in 12/20. He continued on Vesicare with good control of his urinary symptoms. He noted some worsening of his LUTS after running out of Vesicare.  No dysuria or gross hematuria.  AUA score = 13.  He was changed to Toviaz 8 mg daily as he did not feel like the Vesicare was controlling  his symptoms.  He reported nocturia 2-6 times, urgency and incontinence, primarily at night.  No dysuria or gross hematuria.  He reported improvement in his daytime symptoms. AUA score = 15.  At his visit in 1/23, he continued to have symptoms of frequency, nocturia 1-2 times, and incontinence with urgency and without awareness.  No dysuria or gross hematuria.  He is currently taking Vesicare rather than Toviaz. IPSS = 19.   PVR = 0 ml.  He returns  today for follow-up.  He continues to have some symptoms of frequency, nocturia 1-2 times, and occasional urgency with incontinence.  He is continuing on Vesicare.  No dysuria or gross hematuria. He continues to tolerate ADT well. He has not had his bone density study yet. IPSS = 12 today.  Portions of the above documentation were copied from a prior visit for review purposes only.   Allergies  Allergen Reactions   Penicillins Anaphylaxis   Penicillin G Swelling   Statins Other (See Comments)    Generalized Pain    Tramadol     Other reaction(s): HALLUCINATIONS     Past Medical History:  Diagnosis Date   Arthritis    Cancer ([place])    Diabetes mellitus without complication ([place])    Hypertension    Prostate cancer ([place])    Stroke ([place])    Vision abnormalities      Past Surgical History:  Procedure Laterality Date   APPENDECTOMY     BACK SURGERY     CATARACT EXTRACTION Bilateral 2014   EYE SURGERY     NECK SURGERY  2010   TONSILLECTOMY       Social History   Tobacco Use   Smoking status: Former    Packs/day: 1.00    Years: 40.00    Total pack years: 40.00    Types: Cigarettes    Quit date: 06/06/2013    Years since quitting: 8.5   Smokeless tobacco: Never  Substance Use Topics   Alcohol use: No   Drug use: No    ROS: Constitutional:  Negative for fever, chills, weight loss CV: Negative for chest pain, previous MI, hypertension Respiratory:  Negative for shortness of breath, wheezing, sleep apnea, frequent cough GI:  Negative for nausea, vomiting, bloody stool, GERD  PE: There were no vitals taken for this visit. GENERAL APPEARANCE:  Well appearing, well developed, well nourished, NAD HEENT:  Atraumatic, normocephalic, oropharynx clear NECK:  Supple without lymphadenopathy or thyromegaly ABDOMEN:  Soft, non-tender, no masses EXTREMITIES:  Moves all extremities well, without clubbing, cyanosis, or edema NEUROLOGIC:  Alert and oriented x 3, normal gait,  CN II-XII grossly intact MENTAL STATUS:  appropriate BACK:  Non-tender to palpation, No CVAT SKIN:  Warm, dry, and intact   Results: U/A:
